# Patient Record
Sex: Female | Born: 1965 | Race: White | Hispanic: No | Marital: Married | Smoking: Never smoker
Health system: Southern US, Community
[De-identification: ages and names within clinical notes are randomized; demographics above are authoritative.]

## PROBLEM LIST (undated history)

## (undated) HISTORY — PX: SHOULDER SURGERY: SHX246

---

## 1999-02-10 ENCOUNTER — Ambulatory Visit (HOSPITAL_COMMUNITY): Admission: RE | Admit: 1999-02-10 | Discharge: 1999-02-10 | Payer: Self-pay | Admitting: Gastroenterology

## 2000-01-09 ENCOUNTER — Inpatient Hospital Stay (HOSPITAL_COMMUNITY): Admission: AD | Admit: 2000-01-09 | Discharge: 2000-01-12 | Payer: Self-pay | Admitting: Obstetrics & Gynecology

## 2001-03-13 ENCOUNTER — Other Ambulatory Visit: Admission: RE | Admit: 2001-03-13 | Discharge: 2001-03-13 | Payer: Self-pay | Admitting: Obstetrics and Gynecology

## 2002-07-12 ENCOUNTER — Other Ambulatory Visit: Admission: RE | Admit: 2002-07-12 | Discharge: 2002-07-12 | Payer: Self-pay | Admitting: Obstetrics and Gynecology

## 2002-10-24 ENCOUNTER — Emergency Department (HOSPITAL_COMMUNITY): Admission: EM | Admit: 2002-10-24 | Discharge: 2002-10-24 | Payer: Self-pay | Admitting: Emergency Medicine

## 2002-10-24 ENCOUNTER — Encounter: Payer: Self-pay | Admitting: Emergency Medicine

## 2003-07-26 ENCOUNTER — Other Ambulatory Visit: Admission: RE | Admit: 2003-07-26 | Discharge: 2003-07-26 | Payer: Self-pay | Admitting: Obstetrics and Gynecology

## 2003-10-26 HISTORY — PX: APPENDECTOMY: SHX54

## 2003-10-26 HISTORY — PX: COLON SURGERY: SHX602

## 2004-03-03 ENCOUNTER — Encounter (INDEPENDENT_AMBULATORY_CARE_PROVIDER_SITE_OTHER): Payer: Self-pay | Admitting: Specialist

## 2004-03-03 ENCOUNTER — Ambulatory Visit (HOSPITAL_COMMUNITY): Admission: RE | Admit: 2004-03-03 | Discharge: 2004-03-03 | Payer: Self-pay | Admitting: Gastroenterology

## 2004-03-13 ENCOUNTER — Encounter: Admission: RE | Admit: 2004-03-13 | Discharge: 2004-03-13 | Payer: Self-pay | Admitting: Gastroenterology

## 2004-04-16 ENCOUNTER — Inpatient Hospital Stay (HOSPITAL_COMMUNITY): Admission: RE | Admit: 2004-04-16 | Discharge: 2004-04-21 | Payer: Self-pay | Admitting: General Surgery

## 2004-04-16 ENCOUNTER — Encounter (INDEPENDENT_AMBULATORY_CARE_PROVIDER_SITE_OTHER): Payer: Self-pay | Admitting: Specialist

## 2004-07-28 ENCOUNTER — Other Ambulatory Visit: Admission: RE | Admit: 2004-07-28 | Discharge: 2004-07-28 | Payer: Self-pay | Admitting: Obstetrics and Gynecology

## 2005-04-19 ENCOUNTER — Ambulatory Visit: Payer: Self-pay | Admitting: Family Medicine

## 2005-08-26 ENCOUNTER — Other Ambulatory Visit: Admission: RE | Admit: 2005-08-26 | Discharge: 2005-08-26 | Payer: Self-pay | Admitting: Obstetrics and Gynecology

## 2005-10-04 ENCOUNTER — Ambulatory Visit: Payer: Self-pay | Admitting: Family Medicine

## 2006-06-07 ENCOUNTER — Ambulatory Visit: Payer: Self-pay | Admitting: Family Medicine

## 2006-09-23 ENCOUNTER — Ambulatory Visit: Payer: Self-pay | Admitting: Family Medicine

## 2007-04-27 ENCOUNTER — Emergency Department (HOSPITAL_COMMUNITY): Admission: EM | Admit: 2007-04-27 | Discharge: 2007-04-27 | Payer: Self-pay | Admitting: Emergency Medicine

## 2007-05-12 ENCOUNTER — Ambulatory Visit (HOSPITAL_COMMUNITY): Admission: RE | Admit: 2007-05-12 | Discharge: 2007-05-12 | Payer: Self-pay | Admitting: Orthopedic Surgery

## 2007-07-28 ENCOUNTER — Ambulatory Visit (HOSPITAL_COMMUNITY): Admission: RE | Admit: 2007-07-28 | Discharge: 2007-07-28 | Payer: Self-pay | Admitting: Orthopedic Surgery

## 2007-09-15 ENCOUNTER — Ambulatory Visit: Payer: Self-pay | Admitting: Family Medicine

## 2008-11-12 ENCOUNTER — Ambulatory Visit: Payer: Self-pay | Admitting: Family Medicine

## 2010-09-24 ENCOUNTER — Ambulatory Visit: Payer: Self-pay | Admitting: Family Medicine

## 2010-09-24 DIAGNOSIS — R1013 Epigastric pain: Secondary | ICD-10-CM

## 2010-09-24 DIAGNOSIS — R110 Nausea: Secondary | ICD-10-CM

## 2010-09-24 DIAGNOSIS — R109 Unspecified abdominal pain: Secondary | ICD-10-CM

## 2010-09-25 ENCOUNTER — Encounter: Admission: RE | Admit: 2010-09-25 | Discharge: 2010-09-25 | Payer: Self-pay | Admitting: Family Medicine

## 2010-10-27 ENCOUNTER — Encounter: Payer: Self-pay | Admitting: Family Medicine

## 2010-11-26 NOTE — Assessment & Plan Note (Signed)
Summary: STOMACH PROBLEMS/DLO   Vital Signs:  Patient profile:   45 year old female Height:      67.75 inches Weight:      129.75 pounds BMI:     19.95 Temp:     98 degrees F oral Pulse rate:   64 / minute Pulse rhythm:   regular BP sitting:   118 / 76  (left arm) Cuff size:   regular  Vitals Entered By: Lewanda Rife LPN (September 24, 2010 8:37 AM) CC: upper stomach hurts when sits for long trips or after eats pt feels full. Day before menstrual period has lower stomach cramps.   History of Present Illness: has not been here since 2007- has been healthy   for past few years - when she sits and travels -- she gets intense stomach ache  all across upper abdomen - and also radiates to back below shoulder blades  no change with eating at all  4 times in past month -- woke up with bad pain and nausea and could not vomit -- lasted 10 minutes  no stool changes -- but has had generally loose stool for 10 years  hurts to have tight jeans on - without elastic  once without traveling  got relief by hanging over the bed  random and happening more often  if she eats or drinks anything -- gets really bad gas -- both belching and flatus (is embarrasing )  ? if related  no diet change   also really bad cramps with her menses for past 6 mo  much worse than previous   last saw gyn -- Dr Edward Jolly  within 6 mo -- in the fall -- gets pap and mam every year and no problems  cramps are worse since then  menses 3-4 d  first day heavy - but no diff than usual   husb with vasectomy-- no OC needed       Allergies (verified): No Known Drug Allergies  Past History:  Family History: Last updated: 09/24/2010 father HTN , migraines  mother colon cancer  gparents -- CHF brother HTN   Social History: Last updated: 09/24/2010 married  works at a horse farm   Past Medical History: anxiety CS stenosis    [facility]- [facility]- [facility]- gyn  [facility] - [facility]o   Past Surgical  History: colonosc 5/05 large polyp  6/05 peri cecal mass -- mucocele 6/05 laparoscopy R colectomy and appy 6/08 colonosc neg   Family History: father HTN , migraines  mother colon cancer  gparents -- CHF brother HTN   Social History: married  works at a horse farm   Review of Systems General:  Denies chills, fatigue, fever, loss of appetite, and malaise. Eyes:  Denies blurring and eye irritation. CV:  Denies chest pain or discomfort, lightheadness, palpitations, shortness of breath with exertion, and swelling of feet. Resp:  Denies cough and wheezing. GI:  Complains of abdominal pain, gas, and nausea; denies bloody stools, change in bowel habits, dark tarry stools, indigestion, loss of appetite, and vomiting. GU:  Denies abnormal vaginal bleeding, discharge, dysuria, and urinary frequency. MS:  Denies joint redness and joint swelling. Derm:  Denies itching, lesion(s), poor wound healing, and rash. Neuro:  Denies numbness and tingling. Psych:  Denies anxiety and depression.  Physical Exam  General:  slim and well appearing  Head:  normocephalic, atraumatic, and no abnormalities observed.   Eyes:  vision grossly intact, pupils equal, pupils round, and pupils reactive to  light.  no conjunctival pallor, injection or icterus  Mouth:  pharynx pink and moist.   Neck:  supple with full rom and no masses or thyromegally, no JVD or carotid bruit  Chest Wall:  No deformities, masses, or tenderness noted. Lungs:  Normal respiratory effort, chest expands symmetrically. Lungs are clear to auscultation, no crackles or wheezes. Heart:  Normal rate and regular rhythm. S1 and S2 normal without gallop, murmur, click, rub or other extra sounds. Abdomen:  Bowel sounds positive,abdomen soft and non-tender without masses, organomegaly or hernias noted. no pulsitile mass felt no suprapubic tenderness or fullness felt  Msk:  No deformity or scoliosis noted of thoracic or lumbar spine.   Extremities:   No clubbing, cyanosis, edema, or deformity noted with normal full range of motion of all joints.   Neurologic:  gait normal and DTRs symmetrical and normal.   Skin:  Intact without suspicious lesions or rashes no pallor or jaundice Cervical Nodes:  No lymphadenopathy noted Inguinal Nodes:  No significant adenopathy Psych:  normal affect, talkative and pleasant    Impression & Recommendations:  Problem # 1:  ABDOMINAL PAIN, EPIGASTRIC (ICD-789.06) Assessment New intermittent / sometimes severe and rad to back  also intermittent nausea  hx of colectomy - disc poss of scar tissue or other complications -- due for colonosc 2012 also  ref to GI [place] abd trial of omeprazole for poss gastritis  Orders: Radiology Referral (Radiology) Gastroenterology Referral (GI) Prescription Created Electronically [phone])  Problem # 2:  NAUSEA (ICD-787.02) Assessment: New see above Orders: Radiology Referral (Radiology) Gastroenterology Referral (GI) Prescription Created Electronically [phone])  Problem # 3:  PELVIC  PAIN (ICD-789.09) Assessment: New primarily menstrual but also with bloating and upper abd pain  disc poss of perimen change in menses pelvic US ordered to image ovaries -- also in light of early satiety  Orders: Radiology Referral (Radiology) Prescription Created Electronically [phone])  Complete Medication List: 1)  Tums 500 Mg Chew (Calcium carbonate antacid) .... Otc as directed. 2)  Prilosec 20 Mg Cpdr (Omeprazole) .Marland Kitchen.. 1 by mouth once daily in am  Patient Instructions: 1)  avoid much caffiene or spicy foods  2)  take omeprazole daily  3)  we will do ref to GI at check out  4)  we will do ref for ultrasound at check out  Prescriptions: PRILOSEC 20 MG CPDR (OMEPRAZOLE) 1 by mouth once daily in am  #30 x 11   Entered and Authorized by:   Judith Part MD   Signed by:   Judith Part MD on 09/24/2010   Method used:   Electronically to        [facility]  [address]  [phone]* (retail)       [address]       [place], [place]  [id number]       Ph: [phone] or [phone]       Fax: [phone]   RxID:   [id number]    Orders Added: 1)  Radiology Referral [Radiology] 2)  Gastroenterology Referral [GI] 3)  Prescription Created Electronically [[id number]] 4)  Est. Patient Level IV [16010]   Immunization History:  Influenza Immunization History:    Influenza:  historical received at church (08/25/2010)   Immunization History:  Influenza Immunization History:    Influenza:  Historical received at church (08/25/2010)  Current Allergies (reviewed today): No known allergies    Appended Document: STOMACH PROBLEMS/DLO office note faxed to  Dr Randa Evens [phone] as instructed.Marland Kitchen

## 2010-11-26 NOTE — Letter (Signed)
Summary: [facility]   Imported By: Maryln Gottron 11/03/2010 13:51:33  _____________________________________________________________________  External Attachment:    Type:   Image     Comment:   External Document

## 2010-11-26 NOTE — Consult Note (Signed)
Summary: [facility]  [facility]   Imported By: Maryln Gottron 10/05/2010 10:26:30  _____________________________________________________________________  External Attachment:    Type:   Image     Comment:   External Document

## 2011-03-09 NOTE — Consult Note (Signed)
NAME:  Bethany Herman, Bethany Herman                  ACCOUNT NO.:  [id number]   MEDICAL RECORD NO.:  [id number]          PATIENT TYPE:  EMS   LOCATION:  MAJO                         FACILITY:  [facility]   PHYSICIAN:  John L. Rendall, M.D.  DATE OF BIRTH:  02-08-1966   DATE OF CONSULTATION:  04/27/2007  DATE OF DISCHARGE:                                 CONSULTATION   CHIEF COMPLAINT:  Right shoulder and neck injury, struck by a horse.   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 Pafford on her right shoulder  and side of her neck.  She was brought to the hospital by EMS in obvious  discomfort with a deformity of the right shoulder   Upon examination, she had an obvious deformity of the right shoulder  with the humeral head displaced out of the socket approximately 3 inches  inferiorly.  There was paresthesia in the entire hand, but the ulnar  intrinsics, radial musculature and median nerve musculature was all  functioning in the hand.  She had a 2+ radial pulse.  Her neck had mild  discomfort along the right side.  X-rays were obtained of the shoulder  that showed an inferior dislocation of the humeral head with fragments  of bone consistent with greater tuberosity, and there was one the  picture of the shoulder showing defect in the humeral head at the  greater tuberosity.   The patient was planned for the CT scanner at this point, but it was  impossible to put her in the scanner with her shoulder dislocated and  arm at 90 degrees to her body.  Consequently, I injected the shoulder  socket with 2% Xylocaine 10 mL after sterile Betadine prep, and with  sheet in the axilla and longitudinal traction on the arm, the shoulder  was reduced in approximately 1 minute.  Gentle motion was then nontender  and stable.  CT scan head and neck was done and read by Dr. Karin Golden  who  saw no fracture or dislocation.  Post-reduction films in the shoulder  then show reduction of the bone fragments into the greater tuberosity  consistent with greater tuberosity avulsion fracture, now anatomically  reduced.   IMPRESSION:  Inferior dislocation humeral head with fracture, greater  tuberosity, and subsequent anatomic reduction.   PLAN:  Shoulder immobilizer, Vicodin for pain, recheck in the office in  2 weeks, disabled for her usual equestrian activities from a minimum of  6 weeks.      John L. Rendall, M.D.  Electronically Signed     JLR/MEDQ  D:  04/27/2007  T:  04/28/2007  Job:  [id number]

## 2011-03-12 NOTE — Op Note (Signed)
NAME:  Bethany Herman, Bethany Herman                            ACCOUNT NO.:  [id number]   MEDICAL RECORD NO.:  [id number]                   PATIENT TYPE:  AMB   LOCATION:  ENDO                                 FACILITY:  [facility]   PHYSICIAN:  James L. Malon Kindle., M.D.          DATE OF BIRTH:  1965/12/09   DATE OF PROCEDURE:  03/03/2004  DATE OF DISCHARGE:                                 OPERATIVE REPORT   PROCEDURE:  Colonoscopy and biopsy.   ENDOSCOPIST:  Llana Aliment. Edwards, M.D.   MEDICATIONS:  Fentanyl 125 mcg, Versed 12 mg IV.   SCOPE:  Olympus pediatric adjustable colonoscope.   INDICATIONS:  Strong family history of colon cancer in mother, maternal  grandmother had colon cancer and multiple family members had polyps.  She  had a negative colonoscopy five years ago, and this is a 45-year-old  followup.   DESCRIPTION OF PROCEDURE:  The procedure had been explained to the patient  and consent obtained.  With the patient in the left lateral decubitus  position, the Olympus gastroscope was inserted and advanced.  We were able  to advance easily to the cecum.  The patient had what initially appeared to  be a very large ileocecal valve but after placing her in the supine  position, this turned out to be a polypoid mass.  It appeared to be  emanating from the appendix.  The polypoid mass appeared to be emanating  from the appendix.  It was clearly away from the ileocecal valve.  It was  approximately 4 cm diameter.  At that point, it had the appearance of  perfectly round lipoma.  A biopsy forceps was then inserted.  It did not  have a pillow sign.  The mass was somewhat hard but mobile.  It appeared to  be attached at the appendiceal orifice.  I never could never actually see  the appendiceal orifice; it was underneath.  Several biopsies were obtained.  Given the size, location and uncertainty as to the source of this I chose  not to perform a polypectomy at this time.  Due to the massive size, I  thought the risk would be too great.  The scope was withdrawn.  There were  no other polyps seen in the ascending colon, transverse, descending,  sigmoid.  The rectum was also free of polyps.  The scope was withdrawn.  The  patient tolerated the procedure well.   ASSESSMENT:  Massive cecal polyp (211.3).  This does not have the appearance  of a typical adenomatous polyp.  It actually appears more like a submucosal  mass of some sort.  It is difficult to tell.  It appears to be covered with  normal-appearing mucosa.  It is somewhat a hard leiomyoma, carcinoid versus  an adenocarcinoma all as a differential.  Due to the massive size, I think  an endoscopic removal would be quite risky.   PLAN:  We will go ahead and have the patient return to the office after this  pathology is back, and we will make a decision regarding where to go from  here.                                               James L. Malon Kindle., M.D.   Waldron Session  D:  03/03/2004  T:  03/03/2004  Job:  [id number]   cc:   Marne A. Milinda Antis, M.D. [facility]

## 2011-03-12 NOTE — Op Note (Signed)
NAME:  Bethany Herman, Bethany Herman                            ACCOUNT NO.:  [id number]   MEDICAL RECORD NO.:  [id number]                   PATIENT TYPE:  INP   LOCATION:  0357                                 FACILITY:  [facility]   PHYSICIAN:  Angelia Mould. Derrell Lolling, M.D.             DATE OF BIRTH:  [birth year]   DATE OF PROCEDURE:  04/16/2004  DATE OF DISCHARGE:                                 OPERATIVE REPORT   PREOPERATIVE DIAGNOSES:  Mucocele of the appendix.   POSTOPERATIVE DIAGNOSES:  Mucocele of the appendix.   OPERATION:  Diagnostic laparoscopy, right colectomy.   SURGEON:  Angelia Mould. Derrell Lolling, M.D.   FIRST ASSISTANT:  Leonie Man, M.D.   INDICATIONS FOR PROCEDURE:  This is a healthy, 45 year old white female with  a family history of colon cancer in her mother and maternal grandfather. She  had a screening colonoscopy last month which showed a submucosal mass in the  cecum.  This was biopsied and showed benign mucosa. A CT scan showed a large  dilated fluid filled appendix with peripheral calcifications consistent with  a mucocele of the appendix.  There was no evidence of free fluid. There was  no evidence of pseudomyxoma peritonei.  The patient's physical exam is  unremarkable. She was counseled to have a right colectomy.  She is brought  the operating room electively for a diagnostic laparoscopy to rule out any  pseudomyxoma peritonei and then right colectomy.   FINDINGS:  There was about a 3-4 cm smooth rounded mass at the base of the  appendix bulging into the cecal wall.  There was no adenopathy or signs of  any tumor spread anywhere.  Specifically the subdiaphragmatic spaces, liver,  spleen, tubes and ovaries and omentum looked fine. There was no evidence of  any tumor anywhere.  The terminal ileum looked normal.   TECHNIQUE:  Following the induction of general endotracheal anesthesia, a  Foley catheter was placed. The patient was given intravenous antibiotics,  and the abdomen was  prepped and draped in a sterile fashion.  A short  transverse incision was made at the superior rim of the umbilicus. The  fascia was incised transversely and the abdominal cavity entered under  direct vision.  A 10 mm Hasson trocar was inserted and secured with  pursestring suture of #0 Vicryl.  Pneumoperitoneum was created. The video  camera was inserted and with the patient in multiple positions, the entire  subdiaphragmatic space, abdominal space and pelvic areas were examined.  Everything looked perfectly normal.   At this point, the pneumoperitoneum was released and trocar was removed. A  transverse incision was made in the right abdomen at the level of the trocar  site insertion.  The rectus muscle on the right was divided with  electrocautery.  The posterior rectus sheath was divided and the abdominal  cavity entered and explored. With the findings as described above. We  mobilized  the right colon by dividing the lateral peritoneal attachments  inferiorly and then mobilized it more superiorly using electrocautery.  We  were very careful to mobilize the colon completely up off the duodenum and  the kidney until we had mobilized around the right transverse colon and  could clearly visualize the middle colic vessels.  We cleaned off the colon  just to the right of the middle colic vessels, __________ marginal arteries  were clamped, divided and ligated with 2-0 silk ties.  We divided the colon  with the GIA stapling device at this point.  We then cleaned off the  terminal ileum about 3 inches proximal to the ileocecal valve. We divided  the terminal ileum with the GIA stapling device. The mesenteric vessels were  then skeletonized, isolated, clamped, divided and doubly ligated with 2-0  silk ties. The specimen was then removed. Anastomosis was created between  the terminal ileum and the mid transverse colon using a GIA stapling device.  The defect in the bowel wall was closed with a  TA-60 stapling device. Great  care was taken to check for bleeding at all steps.  After completing the  anastomosis, we changed our instruments and gloves and suction devices.  The  mesentery was closed with interrupted figure-of-eight sutures of 3-0 silk.  We checked the anastomosis and it was quite large being almost three fingers  in width. We then checked for bleeding within the abdominal cavity and there  was no bleeding in any of the areas of dissection. We copiously irrigated  the abdomen and pelvis with 2000-3000 mL of saline.  Once again checking for  bleeding there was one. The colon and omentum were returned to their  anatomic positions. The posterior rectus sheath was closed with a running  suture of #1 PDS.  The anterior rectus sheath and the linea alba were closed  with a running suture of #1 PDS.  The skin was closed with skin staples.  Clean bandages were placed and the patient taken to the recovery room in  stable condition.  Estimated blood loss was about 125 mL, complications  none. Sponge, needle and instrument counts were correct.                                               Angelia Mould. Derrell Lolling, M.D.    HMI/MEDQ  D:  04/16/2004  T:  04/16/2004  Job:  [id number]   cc:   Fayrene Fearing L. Malon Kindle., M.D.  [address]  [place]  [place] [address]  Fax: [phone]   Audrie Gallus. Milinda Antis, M.D. [facility]

## 2011-03-12 NOTE — Discharge Summary (Signed)
NAME:  Bethany Herman, Bethany Herman                            ACCOUNT NO.:  [id number]   MEDICAL RECORD NO.:  [id number]                   PATIENT TYPE:  INP   LOCATION:  0370                                 [facility]ACILITY:  [facility]   PHYSICIAN:  Angelia Mould. Derrell Lolling, M.D.             DATE OF BIRTH:  [birth year]   DATE OF ADMISSION:  04/16/2004  DATE OF DISCHARGE:  04/21/2004                                 DISCHARGE SUMMARY   FINAL DIAGNOSIS:  Cystic low-grade appendicle mucinous neoplasm.   OPERATION PERFORMED:  Diagnostic laparoscopy, right colectomy.   HISTORY OF PRESENT ILLNESS:  This is a 45 year old white female in good  health.  She has a positive family history for colon cancer.  She gets a  screening colonoscopy every five years.  A recent colonoscopy showed a  submucosal mass in the cecum.  Biopsy showed only normal colonic mucosa.  A  CT scan showed a dilated fluid-filled appendix with peripheral  calcifications consistent with a mucocele of the appendix but no other  abnormalities were noted on the CT scan.  Specifically, there was no free  fluid and no evidence of pseudomyxoma peritonei.  She was sent to me for  evaluation in the office.  I advised surgical resection of this area and  assessment for malignancy, although it was unclear whether this was a cyst  adenoma or a cyst adenocarcinoma.  She underwent bowel prep at home and was  brought to the hospital electively.   PHYSICAL EXAMINATION:  VITAL SIGNS:  Weight 133.  Height 5 feet 8 inches.  GENERAL:  A healthy-appearing young woman in no distress.  NECK:  No adenopathy.  LUNGS:  Clear to auscultation.  HEART:  Regular rate and rhythm.  No murmur.  ABDOMEN:  Soft, scaphoid.  Nontender.  No palpable mass.   HOSPITAL COURSE:  On the day of admission, the patient was taken to the  operating room.  We performed diagnostic laparoscopy and looked thoroughly  around the abdomen and pelvis and saw no evidence of any tumor anywhere.  We  then  performed a right colectomy through a short transverse right-sided  abdominal incision.  I could palpate this fluid mass in the wall of the  cecum, but there were no signs of any adenopathy.  The surgery was  uneventful.   Final pathology report shows a mucinous neoplasm of the appendix.  There was  minima atypia.  There were no signs of any invasion.  Seven lymph nodes were  examined, and there was no tumor.  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   Postoperatively, the patient did well.  She progressed very steadily over  the next few days with her diet and activities and was ready to go home on  April 21, 2004.  At that time, she was tolerating a regular diet, having  bowel movements.  Her abdomen was soft and benign.  The wound looked good.  Staples were removed.  Steri-Strips were applied.  A prescription for  Vicodin was given.  She was asked to return to see me in the office in two  weeks.                                               Angelia Mould. Derrell Lolling, M.D.    HMI/MEDQ  D:  04/25/2004  T:  04/25/2004  Job:  [id number]   cc:   Fayrene Fearing L. Malon Kindle., M.D.  [address]  [place]  [place] [address]  Fax: [phone]   Audrie Gallus. Milinda Antis, M.D. [facility]

## 2011-09-27 IMAGING — US US PELVIS COMPLETE
2 series · 13 of 25 positions shown · non-contrast
Comparison: None.

CLINICAL DATA: Epigastric and pelvic pain.  LMP 09/05/2010



[Series 1: us pelvis complete · 0.30mm/px · 12 of 70 slices shown (1 of 2)]
[im 1/70]
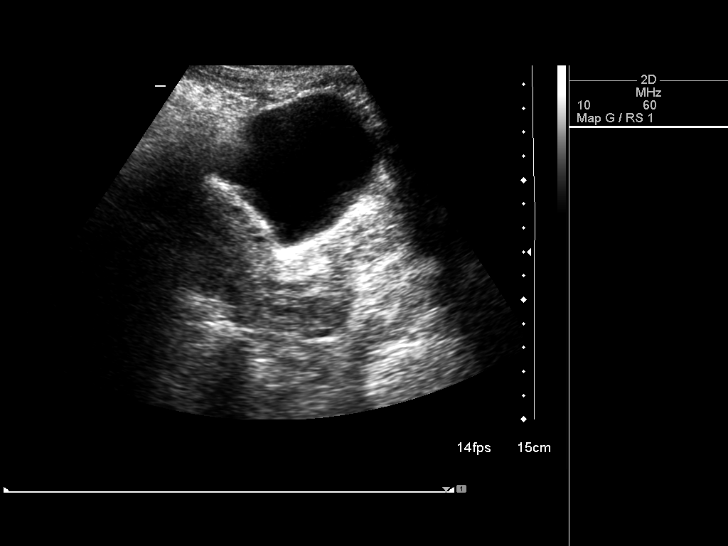
[im 7/70]
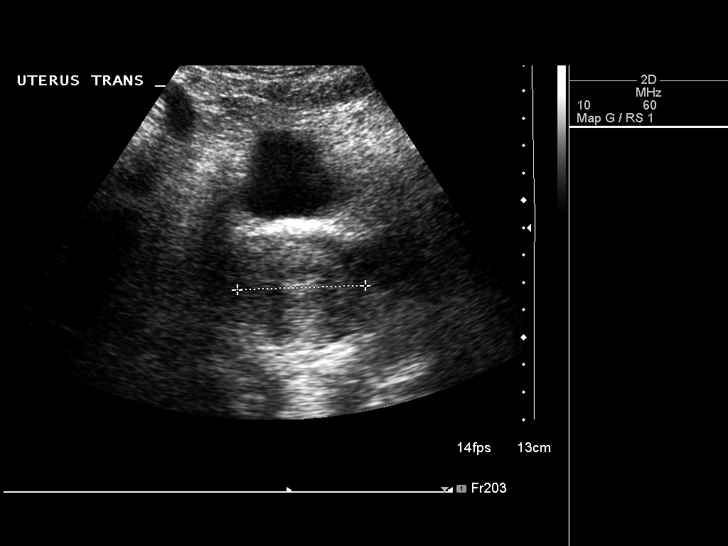
[im 13/70]
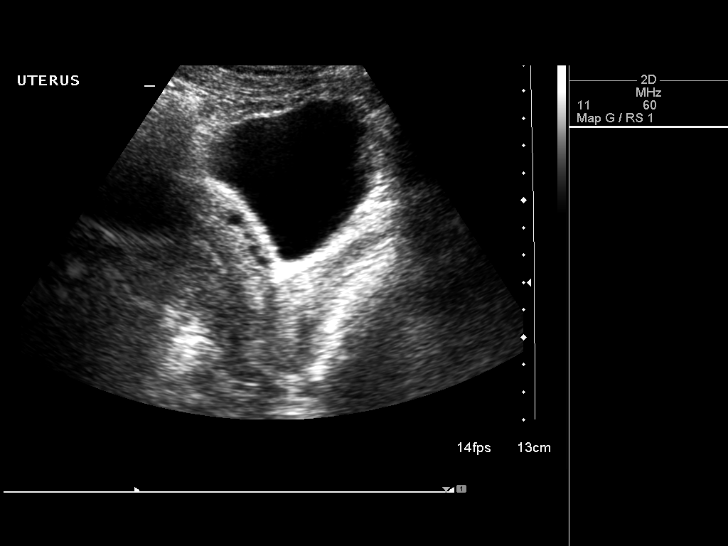
[im 19/70]
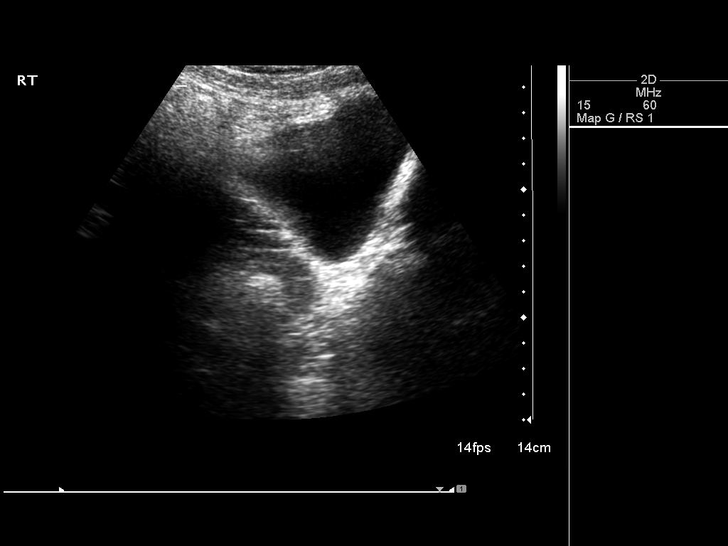
[im 25/70]
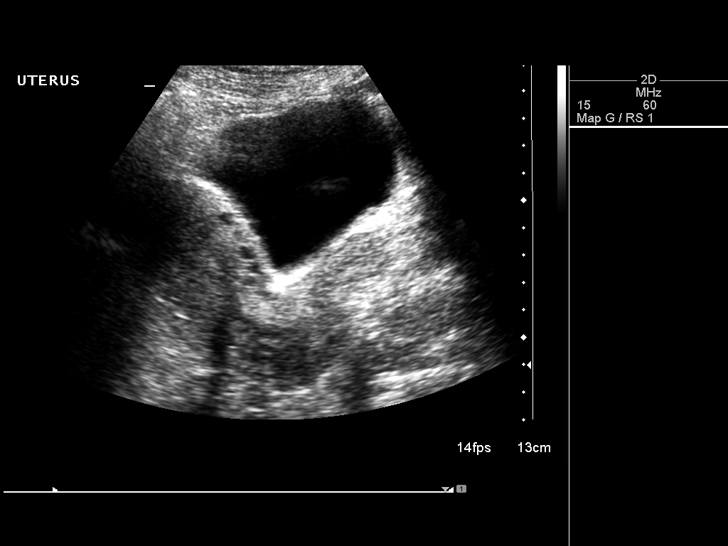
[im 31/70]
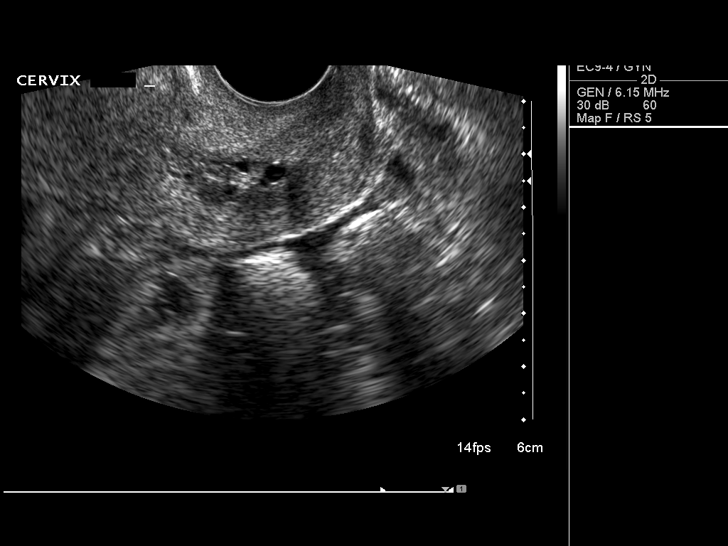
[im 37/70]
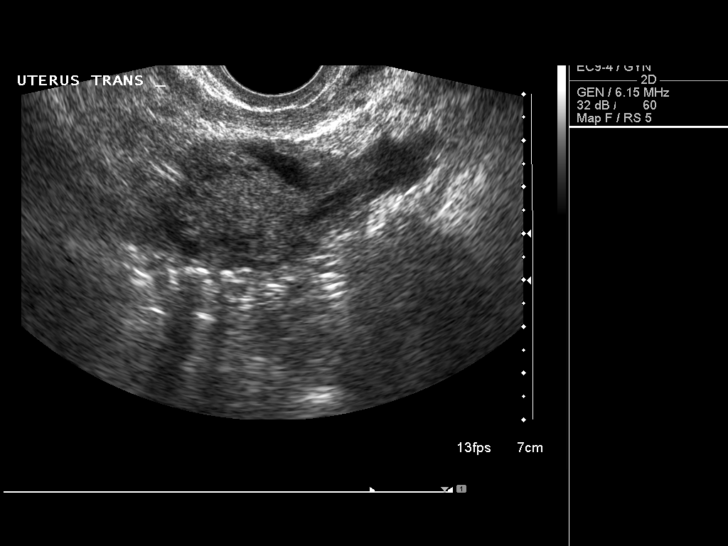
[im 43/70]
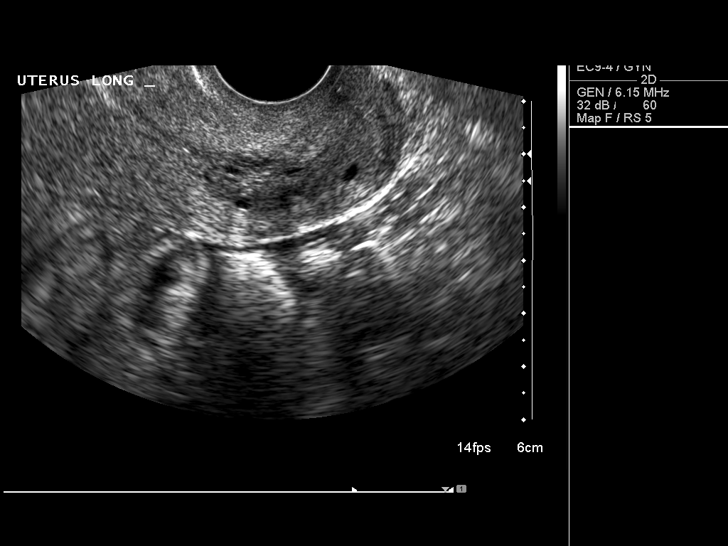
[im 49/70]
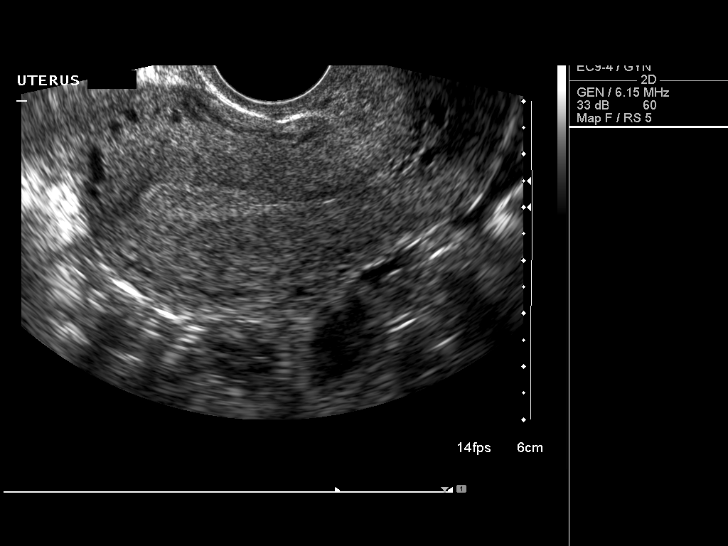
[im 55/70]
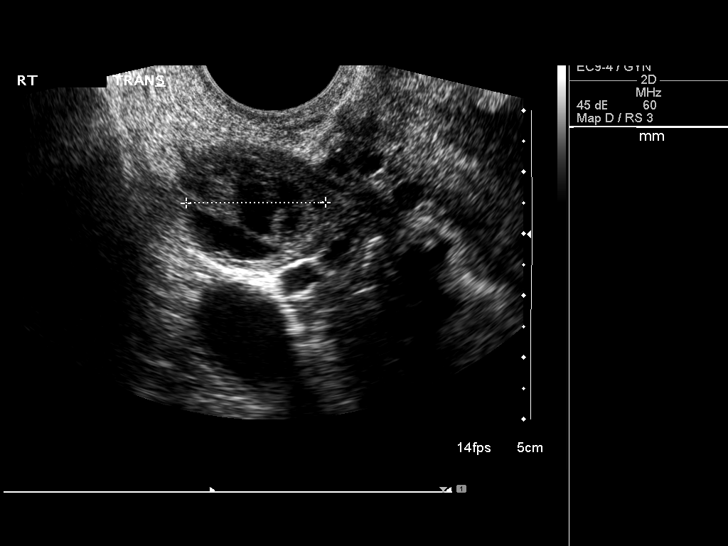
[im 61/70]
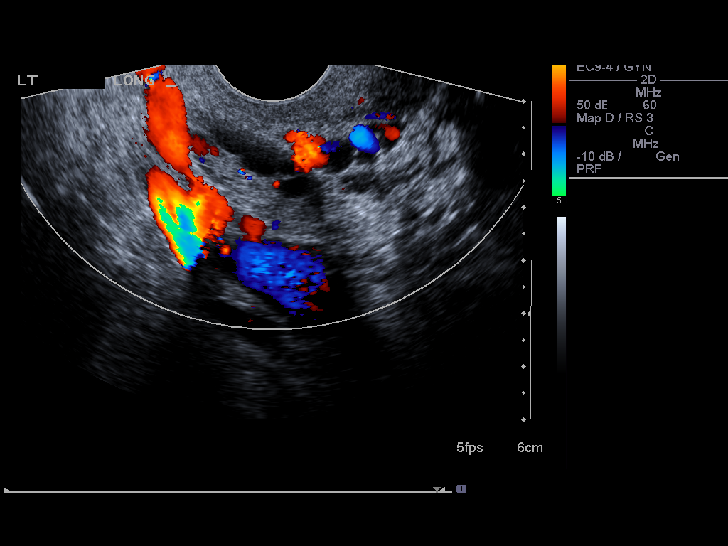
[im 67/70]
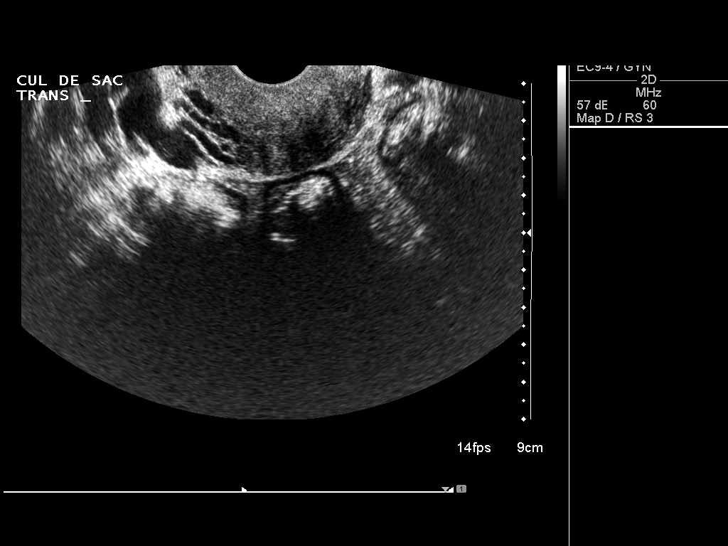

[Series 2: us pelvis complete · 0.26mm/px · 1 of 1 slices shown (2 of 2)]
[im 1/1]
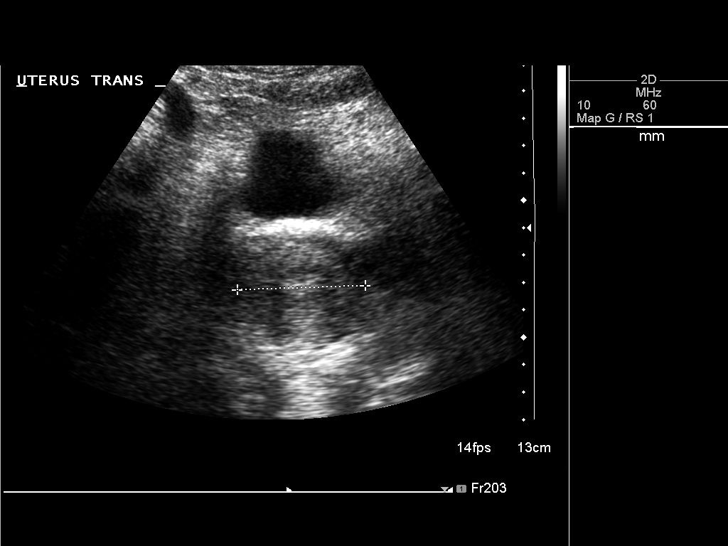

[13 of 25 positions shown; findings below may reference images not displayed]

FINDINGS: Uterus the uterus has a sagittal length of 7.9 cm, an AP depth of
4.2 cm and a transverse width of 5.0 cm.  A homogeneous uterine
myometrium is seen.

Endometrium has a late tri- layered appearance with an AP width of
7.2 mm.  No areas of focal thickening or heterogeneity are seen and
this would correlate with a late periovulatory pelvic ultrasound
and correspond the patient's given LMP of 09/05/2010.

Right Ovary has a normal appearance measuring 2.3 x 1.5 x 2.3 cm
and contains a 2.3 x 1.5 x 2.3 cm avascular complex cyst with,
likely a hemorrhagic corpus luteum

Left Ovary has a normal appearance measuring 3.2 x 1.7 x 2.0 cm

Other Findings:  No pelvic fluid or separate adnexal masses are
seen.
IMPRESSION: Small complex right ovarian cyst likely representing a hemorrhagic
corpus luteum given the patient's phase of cycle and imaging
characteristics. Short-term follow-up is recommended with
rescanning in the immediate postsecretory phase of the cycle
following the next complete cycle given the patient's history of
pelvic pain and  appearance.

Normal uterine myometrium, endometrium and left ovary

## 2011-09-27 IMAGING — US US ABDOMEN COMPLETE
1 series · 14 of 25 positions shown · non-contrast
Comparison: CT abdomen 03/13/2004.

CLINICAL DATA: 44-year-old female with epigastric and pelvic pain.

COMPLETE ABDOMINAL ULTRASOUND

[Series 1: us abdomen complete · 0.30mm/px · 14 of 73 slices shown]
[im 1/73]
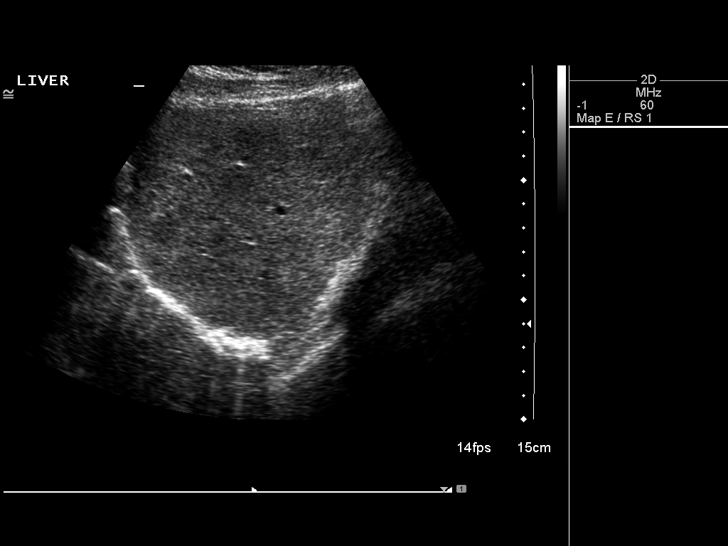
[im 7/73]
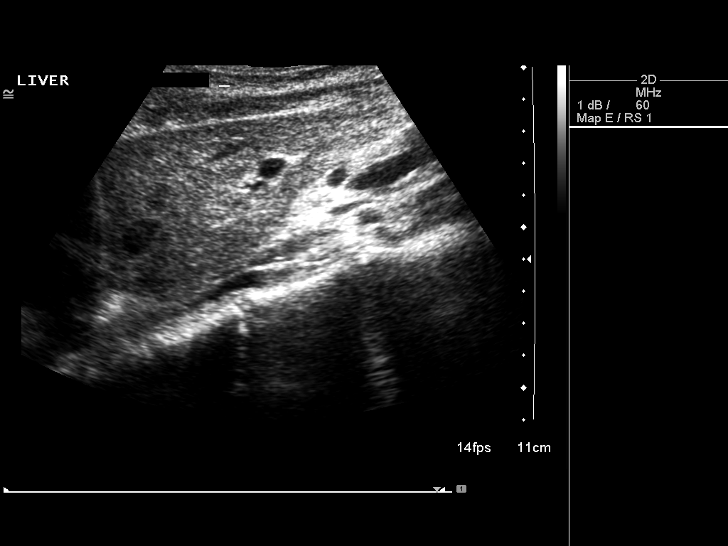
[im 13/73]
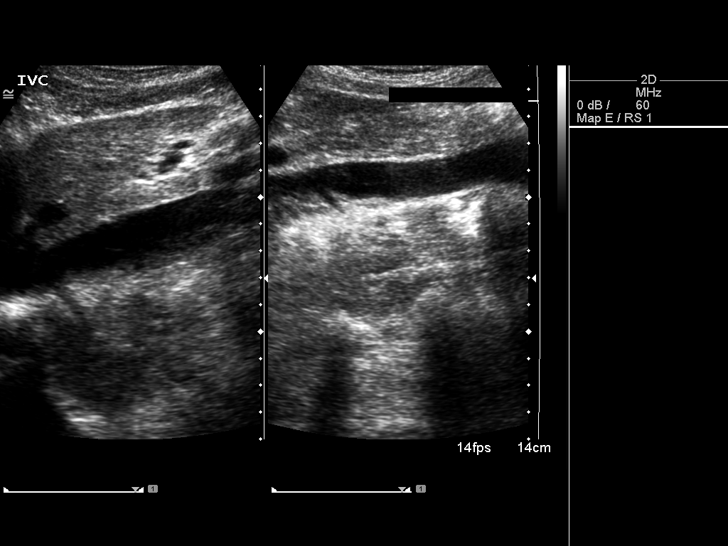
[im 19/73]
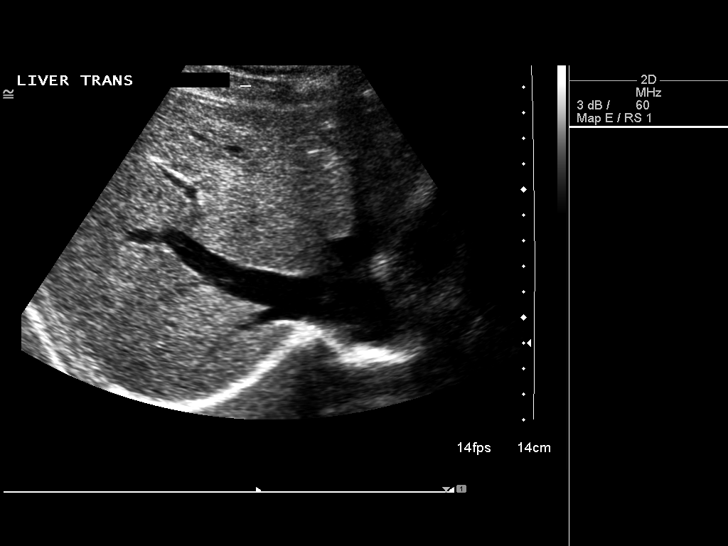
[im 25/73]
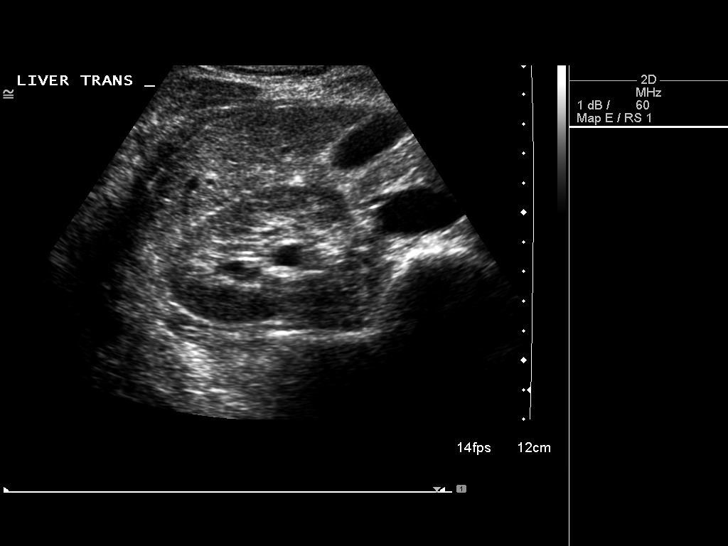
[im 28/73]
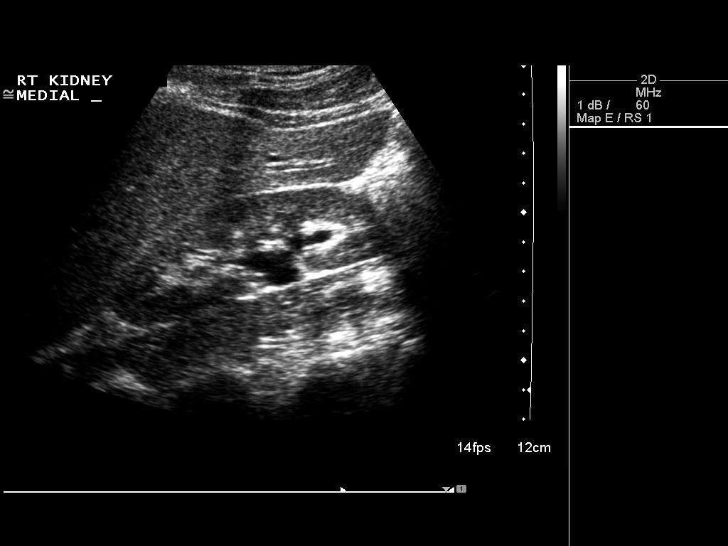
[im 34/73]
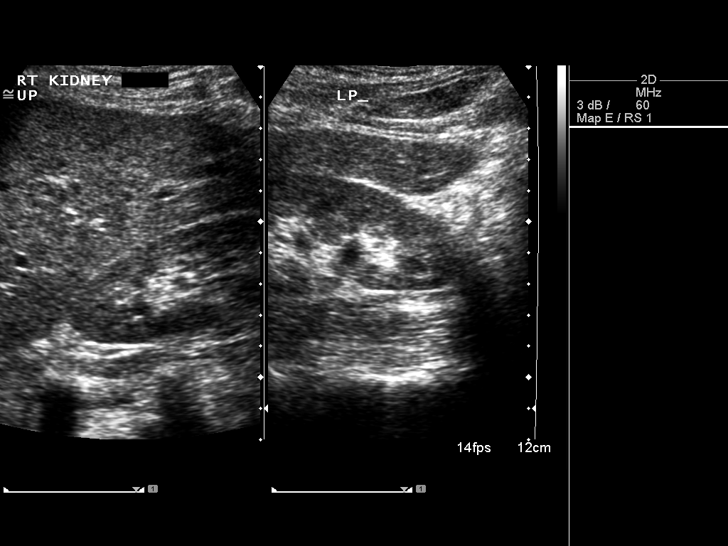
[im 40/73]
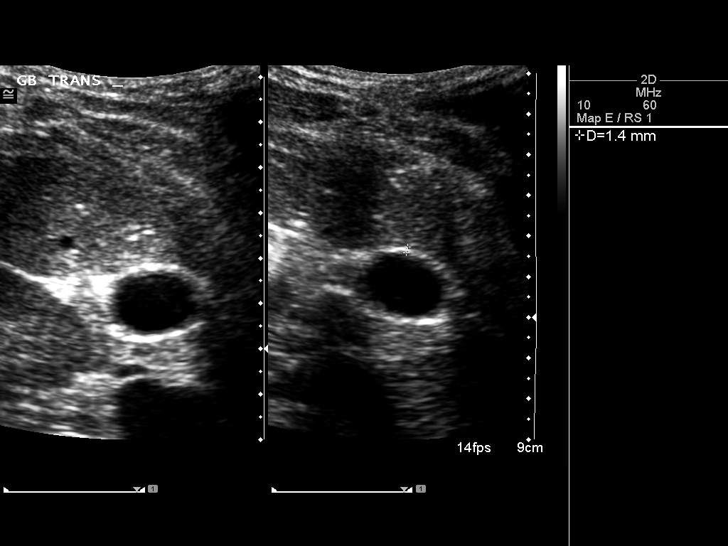
[im 46/73]
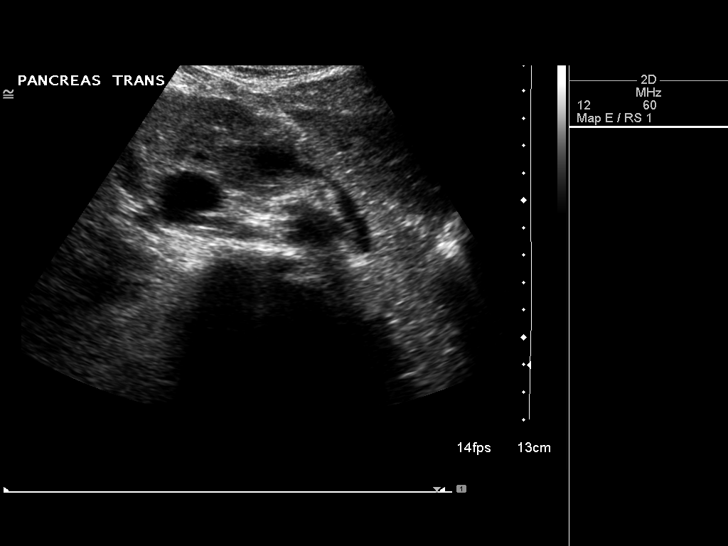
[im 49/73]
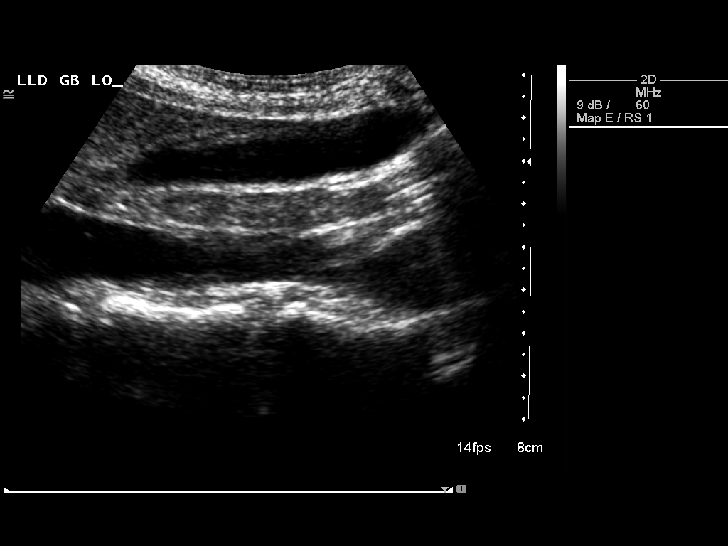
[im 55/73]
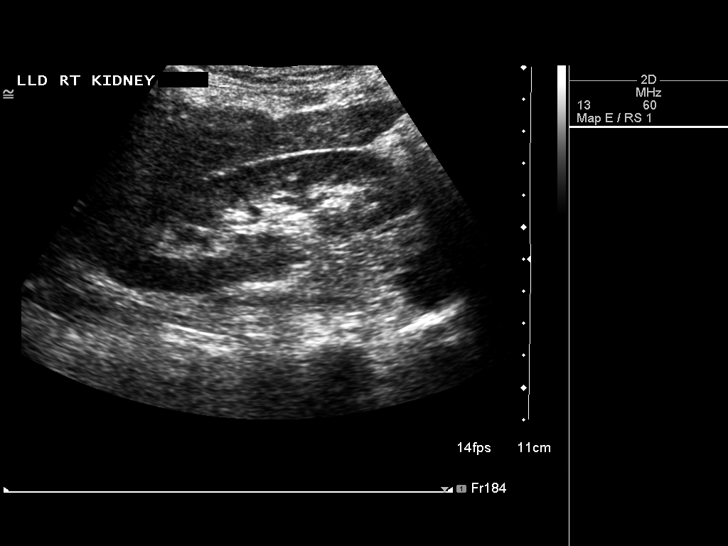
[im 61/73]
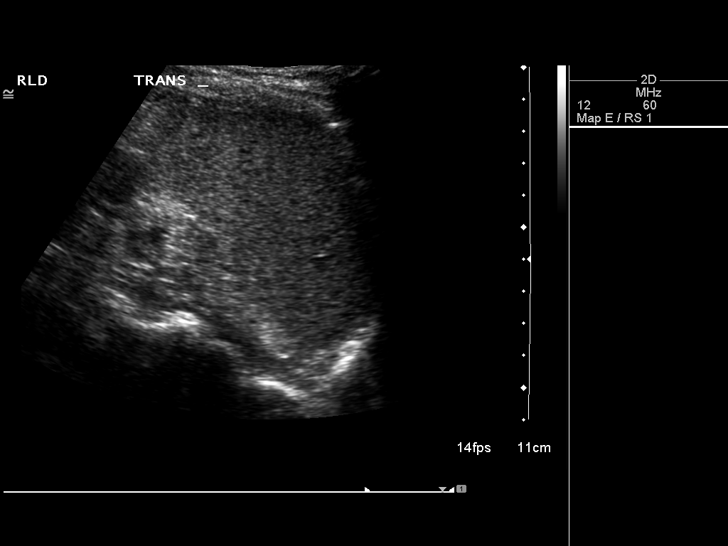
[im 67/73]
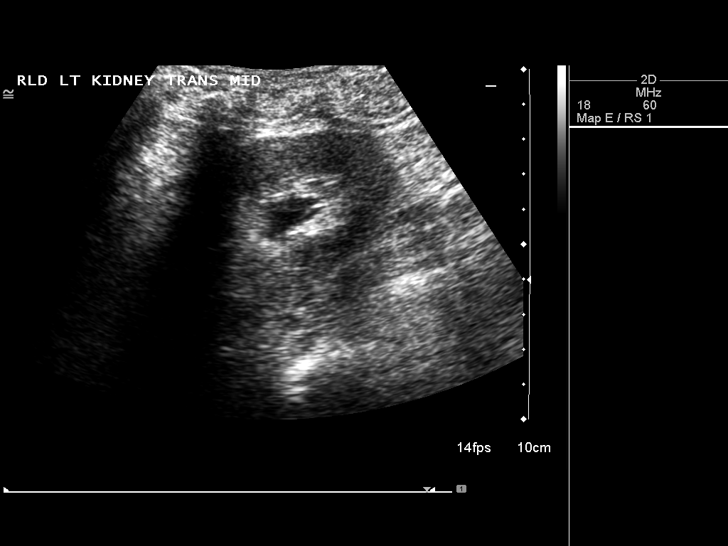
[im 73/73]
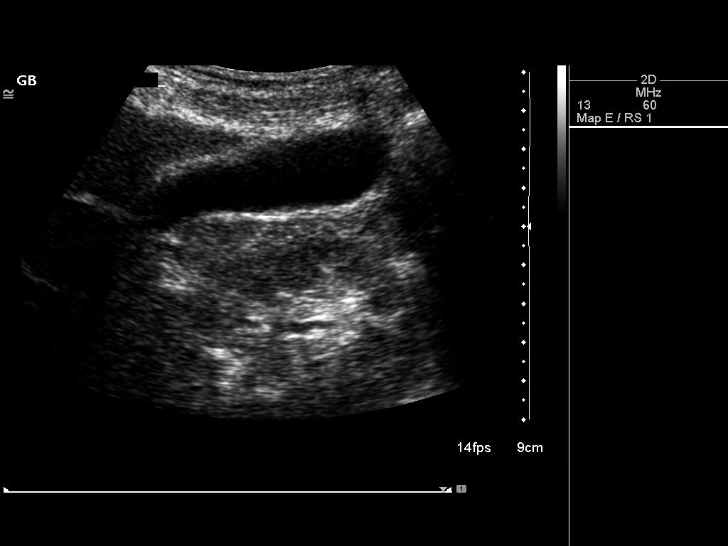

[14 of 25 positions shown; findings below may reference images not displayed]

FINDINGS: Gallbladder:  No gallstones, gallbladder wall thickening, or
pericholecystic fluid.

Common bile duct:  Normal measuring 3 mm in diameter.

Liver:  No focal lesion identified.  Within normal limits in
parenchymal echogenicity.

IVC:  Appears normal.

Pancreas:  No focal abnormality seen.

Spleen:  Normal measuring 6.6 cm in length.

Right Kidney:  Normal measuring 10.8 cm in length.

Left Kidney:  Normal.  There is a small central renal cyst versus
prominent renal pelvis measuring 1.5 x 1.1 x 2.0 cm.  Renal length
10.8 cm.

Abdominal aorta:  No aneurysm identified.
IMPRESSION: 1.  Normal gallbladder and no acute abdominal findings.
2.  Incidental left renal benign cyst versus prominent renal
pelvis.

## 2012-01-13 ENCOUNTER — Other Ambulatory Visit: Payer: Self-pay

## 2012-01-13 MED ORDER — DIAZEPAM 5 MG PO TABS: ORAL_TABLET | ORAL | Status: DC

## 2012-01-13 NOTE — Telephone Encounter (Signed)
Schedule f/u this spring please Px written for call in

## 2012-01-13 NOTE — Telephone Encounter (Signed)
Rx called to [facility], patient advised as instructed via telephone. 

## 2012-01-13 NOTE — Telephone Encounter (Signed)
Pt request refill on Diazepam 5 mg. Pt said last filled in 2005 due to her flying. Pt said she will be flying several times this spring( her first flight is tomorrow 03/15/12). Pt request refill on Diazepam 5 mg #10 [facility] [address]. Pt last seen by Dr Milinda Antis 09/24/2010... Pt can be reached at [phone].

## 2012-02-15 ENCOUNTER — Other Ambulatory Visit: Payer: Self-pay | Admitting: Obstetrics and Gynecology

## 2012-02-15 ENCOUNTER — Other Ambulatory Visit: Payer: Self-pay

## 2012-02-15 DIAGNOSIS — N63 Unspecified lump in unspecified breast: Secondary | ICD-10-CM

## 2012-02-18 ENCOUNTER — Ambulatory Visit
Admission: RE | Admit: 2012-02-18 | Discharge: 2012-02-18 | Disposition: A | Discharge status: Home / Self Care | Payer: BC Managed Care – PPO | Source: Ambulatory Visit | Attending: Obstetrics and Gynecology | Admitting: Obstetrics and Gynecology

## 2012-02-18 DIAGNOSIS — N63 Unspecified lump in unspecified breast: Secondary | ICD-10-CM

## 2012-04-19 HISTORY — PX: COLONOSCOPY: SHX174

## 2012-04-26 ENCOUNTER — Encounter: Payer: Self-pay | Admitting: Family Medicine

## 2012-05-03 ENCOUNTER — Encounter: Payer: Self-pay | Admitting: Family Medicine

## 2012-08-15 ENCOUNTER — Other Ambulatory Visit: Payer: Self-pay | Admitting: Obstetrics and Gynecology

## 2012-08-15 DIAGNOSIS — D249 Benign neoplasm of unspecified breast: Secondary | ICD-10-CM

## 2012-08-21 ENCOUNTER — Ambulatory Visit
Admission: RE | Admit: 2012-08-21 | Discharge: 2012-08-21 | Disposition: A | Discharge status: Home / Self Care | Payer: BC Managed Care – PPO | Source: Ambulatory Visit | Attending: Obstetrics and Gynecology | Admitting: Obstetrics and Gynecology

## 2012-08-21 DIAGNOSIS — D249 Benign neoplasm of unspecified breast: Secondary | ICD-10-CM

## 2012-11-24 ENCOUNTER — Ambulatory Visit (INDEPENDENT_AMBULATORY_CARE_PROVIDER_SITE_OTHER): Payer: BC Managed Care – PPO | Admitting: Family Medicine

## 2012-11-24 ENCOUNTER — Encounter: Payer: Self-pay | Admitting: Family Medicine

## 2012-11-24 VITALS — BP 116/76 | HR 52 | Temp 98.0°F | Ht 68.0 in | Wt 130.0 lb

## 2012-11-24 DIAGNOSIS — Z23 Encounter for immunization: Secondary | ICD-10-CM

## 2012-11-24 DIAGNOSIS — Z0000 Encounter for general adult medical examination without abnormal findings: Secondary | ICD-10-CM

## 2012-11-24 LAB — CBC WITH DIFFERENTIAL/PLATELET
Eosinophils Relative: 0.1 % (ref 0.0–5.0)
Lymphocytes Relative: 34.5 % (ref 12.0–46.0)
Lymphs Abs: 2 10*3/uL (ref 0.7–4.0)
MCV: 93.8 fl (ref 78.0–100.0)
Monocytes Absolute: 0.6 10*3/uL (ref 0.1–1.0)
Monocytes Relative: 9.9 % (ref 3.0–12.0)
Neutro Abs: 3.2 10*3/uL (ref 1.4–7.7)
Neutrophils Relative %: 54.6 % (ref 43.0–77.0)
Platelets: 226 10*3/uL (ref 150.0–400.0)
WBC: 5.8 10*3/uL (ref 4.5–10.5)

## 2012-11-24 LAB — POCT URINALYSIS DIPSTICK
Bilirubin, UA: NEGATIVE
Ketones, UA: NEGATIVE
Leukocytes, UA: NEGATIVE
Nitrite, UA: NEGATIVE
Spec Grav, UA: 1.025
pH, UA: 6.5

## 2012-11-24 LAB — LIPID PANEL
Cholesterol: 190 mg/dL (ref 0–200)
HDL: 85.5 mg/dL (ref >39.00)
LDL Cholesterol: 95 mg/dL (ref 0–99)
Total CHOL/HDL Ratio: 2
VLDL: 10 mg/dL (ref 0.0–40.0)

## 2012-11-24 LAB — COMPREHENSIVE METABOLIC PANEL
Albumin: 3.9 g/dL (ref 3.5–5.2)
Chloride: 102 mEq/L (ref 96–112)
GFR: 82.31 mL/min (ref >60.00)
Glucose, Bld: 89 mg/dL (ref 70–99)
Sodium: 136 mEq/L (ref 135–145)

## 2012-11-24 NOTE — Progress Notes (Signed)
Subjective:    Patient ID: Bethany Herman, female    DOB: Jun 26, 1966, 47 y.o.   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 When her last one  Needs that today  Flu vaccine -- got one today   Pap 4/13 Gyn - she sees Dr Edward Jolly - will be seeing one of her partners - Nestor Ramp Had normal periods -now irregular - and getting close to menopause   Mammogram 4/13 - got in Dr Rica Records office  Had to [facility]  No lumps on self exam   colonosc 6/13 with 5 year recall Dr Randa Evens  Vision good  Hearing good  bp good   Mood- is pretty despite the stressors   Patient Active Problem List  Diagnosis  . NAUSEA  . ABDOMINAL PAIN, EPIGASTRIC  . PELVIC  PAIN  . Routine general medical examination at a health care facility   No past medical history on file. Past Surgical History  Procedure Date  . Colonoscopy 04/19/12    normal/ re check 5 years (Dr Randa Evens)   History  Substance Use Topics  . Smoking status: Never Smoker   . Smokeless tobacco: Not on file  . Alcohol Use: Yes     Comment: social   No family history on file. No Known Allergies Current Outpatient Prescriptions on File Prior to Visit  Medication Sig Dispense Refill  . diazepam (VALIUM) 5 MG tablet 1 by mouth as needed prior to airplane flight  10 tablet  0      Review of Systems Review of Systems  Constitutional: Negative for fever, appetite change, fatigue and unexpected weight change.  Eyes: Negative for pain and visual disturbance.   Respiratory: Negative for cough and shortness of breath.   Cardiovascular: Negative for cp or palpitations    Gastrointestinal: Negative for nausea, diarrhea and constipation.  Genitourinary: Negative for urgency and frequency.  Skin: Negative for pallor or rash   Neurological: Negative for weakness, light-headedness, numbness and headaches.  Hematological: Negative for adenopathy. Does not bruise/bleed easily.  Psychiatric/Behavioral: Negative for dysphoric mood. The patient is not nervous/anxious.         Objective:   Physical Exam  Constitutional: She appears well-developed and well-nourished. No distress.  HENT:  Head: Normocephalic and atraumatic.  Right Ear: External ear normal.  Left Ear: External ear normal.  Nose: Nose normal.  Mouth/Throat: Oropharynx is clear and moist.  Eyes: Conjunctivae normal and EOM are normal. Pupils are equal, round, and reactive to light. Right eye exhibits no discharge. Left eye exhibits no discharge. No scleral icterus.  Neck: Normal range of motion. Neck supple. No JVD present. Carotid bruit is not present. No thyromegaly present.  Cardiovascular: Normal rate, regular rhythm, normal heart sounds and intact distal pulses.  Exam reveals no gallop.   Pulmonary/Chest: Effort normal and breath sounds normal. No respiratory distress. She has no wheezes. She has no  rales.  Abdominal: Soft. Bowel sounds are normal. She exhibits no distension, no abdominal bruit and no mass. There is no tenderness. There is no rebound.  Musculoskeletal: She exhibits no edema and no tenderness.  Lymphadenopathy:    She has no cervical adenopathy.  Neurological: She is alert. She has normal reflexes. No cranial nerve deficit. She exhibits normal muscle tone. Coordination normal.  Skin: Skin is warm and dry. No rash noted. No erythema. No pallor.  Psychiatric: She has a normal mood and affect.          Assessment & Plan:

## 2012-11-24 NOTE — Patient Instructions (Addendum)
Flu shot today Tdap - tetanus shot today Follow up with your dermatologist about skin spots  Labs today  Eat a healthy diet /exercise and take care of yourself  Try to get 1200-1500 mg of calcium per day with at least 1000 iu of vitamin D - for bone health  Drop off your DOT form when you get a chance

## 2012-11-24 NOTE — Assessment & Plan Note (Signed)
Reviewed health habits including diet and exercise and skin cancer prevention Also reviewed health mt list, fam hx and immunizations   Flu shot today Tdap today Wellness labs today Nl ua / vis/ hearing -will drop off DOT form rec f/u with derm for itchy areas on R foot and mid back - ? Eczema or early psoriasis

## 2012-11-27 ENCOUNTER — Encounter: Payer: Self-pay | Admitting: *Deleted

## 2012-11-28 ENCOUNTER — Telehealth: Payer: Self-pay | Admitting: Family Medicine

## 2012-11-28 NOTE — Telephone Encounter (Signed)
Forms placed in your in box.

## 2012-11-28 NOTE — Telephone Encounter (Signed)
Bethany Herman dropped off a Dot physcial from to be filled out by Dr. Milinda Antis.

## 2012-11-28 NOTE — Telephone Encounter (Signed)
Left voicemail letting pt know form is ready for pick-up 

## 2012-11-28 NOTE — Telephone Encounter (Signed)
Done and in IN box 

## 2012-12-11 NOTE — Telephone Encounter (Signed)
Pt received letter from DOT requesting more info for DOT form submitted and pt needs the card filled out for DOT. Pt will get a blank DOT card and bring letter with info needed to office.

## 2012-12-11 NOTE — Telephone Encounter (Signed)
Ok- will do that when it arrives

## 2012-12-13 ENCOUNTER — Telehealth: Payer: Self-pay | Admitting: *Deleted

## 2012-12-13 NOTE — Telephone Encounter (Signed)
Pt notified form ready for pick up 

## 2012-12-13 NOTE — Telephone Encounter (Signed)
Husband Brett Canales, dropped off [employer] card and forms to be completed.  Please call [phone] when ready to pick up.  Thank you.

## 2012-12-13 NOTE — Telephone Encounter (Signed)
Done and in IN box 

## 2012-12-13 NOTE — Telephone Encounter (Signed)
Form placed in your Inbox

## 2013-01-29 ENCOUNTER — Other Ambulatory Visit: Payer: Self-pay | Admitting: Orthopedic Surgery

## 2013-01-29 ENCOUNTER — Encounter (HOSPITAL_BASED_OUTPATIENT_CLINIC_OR_DEPARTMENT_OTHER): Payer: Self-pay | Admitting: *Deleted

## 2013-01-29 NOTE — Progress Notes (Signed)
No labs needed-splint off-hand briused and swollen-told to keep it elevated and splint on.

## 2013-01-31 ENCOUNTER — Encounter (HOSPITAL_BASED_OUTPATIENT_CLINIC_OR_DEPARTMENT_OTHER)
Admission: RE | Disposition: A | Discharge status: Home / Self Care | Payer: Self-pay | Source: Ambulatory Visit | Attending: Orthopedic Surgery

## 2013-01-31 ENCOUNTER — Encounter (HOSPITAL_BASED_OUTPATIENT_CLINIC_OR_DEPARTMENT_OTHER): Payer: Self-pay | Admitting: Anesthesiology

## 2013-01-31 ENCOUNTER — Encounter (HOSPITAL_BASED_OUTPATIENT_CLINIC_OR_DEPARTMENT_OTHER): Payer: Self-pay

## 2013-01-31 ENCOUNTER — Ambulatory Visit (HOSPITAL_BASED_OUTPATIENT_CLINIC_OR_DEPARTMENT_OTHER)
Admission: RE | Admit: 2013-01-31 | Discharge: 2013-01-31 | Disposition: A | Discharge status: Home / Self Care | Payer: BC Managed Care – PPO | Source: Ambulatory Visit | Attending: Orthopedic Surgery | Admitting: Orthopedic Surgery

## 2013-01-31 ENCOUNTER — Ambulatory Visit (HOSPITAL_BASED_OUTPATIENT_CLINIC_OR_DEPARTMENT_OTHER): Payer: BC Managed Care – PPO | Admitting: Anesthesiology

## 2013-01-31 DIAGNOSIS — IMO0002 Reserved for concepts with insufficient information to code with codable children: Secondary | ICD-10-CM | POA: Insufficient documentation

## 2013-01-31 DIAGNOSIS — I519 Heart disease, unspecified: Secondary | ICD-10-CM | POA: Insufficient documentation

## 2013-01-31 DIAGNOSIS — S62309A Unspecified fracture of unspecified metacarpal bone, initial encounter for closed fracture: Secondary | ICD-10-CM | POA: Insufficient documentation

## 2013-01-31 HISTORY — PX: OPEN REDUCTION INTERNAL FIXATION (ORIF) METACARPAL: SHX6234

## 2013-01-31 SURGERY — OPEN REDUCTION INTERNAL FIXATION (ORIF) METACARPAL
Anesthesia: General | Site: Hand | Laterality: Left | Wound class: Clean

## 2013-01-31 MED ORDER — PROPOFOL 10 MG/ML IV BOLUS
INTRAVENOUS | Status: DC | PRN
  Administered 2013-01-31: 200 mg via INTRAVENOUS

## 2013-01-31 MED ORDER — CEFAZOLIN SODIUM-DEXTROSE 2-3 GM-% IV SOLR
2.0000 g | INTRAVENOUS | Status: AC
  Administered 2013-01-31: 2 g via INTRAVENOUS

## 2013-01-31 MED ORDER — MIDAZOLAM HCL 2 MG/2ML IJ SOLN: 1.0000 mg | INTRAMUSCULAR | Status: DC | PRN

## 2013-01-31 MED ORDER — ONDANSETRON HCL 4 MG/2ML IJ SOLN
INTRAMUSCULAR | Status: DC | PRN
  Administered 2013-01-31: 4 mg via INTRAVENOUS

## 2013-01-31 MED ORDER — HYDROCODONE-ACETAMINOPHEN 7.5-325 MG PO TABS: 1.0000 | ORAL_TABLET | Freq: Four times a day (QID) | ORAL | Status: DC | PRN

## 2013-01-31 MED ORDER — HYDROMORPHONE HCL PF 1 MG/ML IJ SOLN
0.2500 mg | INTRAMUSCULAR | Status: DC | PRN
  Administered 2013-01-31 (×4): 0.5 mg via INTRAVENOUS

## 2013-01-31 MED ORDER — ACETAMINOPHEN 10 MG/ML IV SOLN
INTRAVENOUS | Status: DC | PRN
  Administered 2013-01-31: 1000 mg via INTRAVENOUS

## 2013-01-31 MED ORDER — LACTATED RINGERS IV SOLN
INTRAVENOUS | Status: DC
  Administered 2013-01-31 (×2): via INTRAVENOUS

## 2013-01-31 MED ORDER — MIDAZOLAM HCL 2 MG/2ML IJ SOLN: 0.5000 mg | Freq: Once | INTRAMUSCULAR | Status: DC | PRN

## 2013-01-31 MED ORDER — MIDAZOLAM HCL 5 MG/5ML IJ SOLN
INTRAMUSCULAR | Status: DC | PRN
  Administered 2013-01-31: 2 mg via INTRAVENOUS

## 2013-01-31 MED ORDER — CHLORHEXIDINE GLUCONATE 4 % EX LIQD: 60.0000 mL | Freq: Once | CUTANEOUS | Status: DC

## 2013-01-31 MED ORDER — BUPIVACAINE HCL (PF) 0.25 % IJ SOLN
INTRAMUSCULAR | Status: DC | PRN
  Administered 2013-01-31: 7 mL

## 2013-01-31 MED ORDER — FENTANYL CITRATE 0.05 MG/ML IJ SOLN
50.0000 ug | INTRAMUSCULAR | Status: DC | PRN
  Administered 2013-01-31: 100 ug via INTRAVENOUS

## 2013-01-31 MED ORDER — ACETAMINOPHEN 10 MG/ML IV SOLN
1000.0000 mg | Freq: Once | INTRAVENOUS | Status: AC
  Administered 2013-01-31: 1000 mg via INTRAVENOUS

## 2013-01-31 MED ORDER — LIDOCAINE HCL (CARDIAC) 20 MG/ML IV SOLN
INTRAVENOUS | Status: DC | PRN
  Administered 2013-01-31: 40 mg via INTRAVENOUS

## 2013-01-31 MED ORDER — CEFAZOLIN SODIUM-DEXTROSE 2-3 GM-% IV SOLR: 2.0000 g | INTRAVENOUS | Status: DC

## 2013-01-31 MED ORDER — MEPERIDINE HCL 25 MG/ML IJ SOLN: 6.2500 mg | INTRAMUSCULAR | Status: DC | PRN

## 2013-01-31 MED ORDER — PROMETHAZINE HCL 25 MG/ML IJ SOLN: 6.2500 mg | INTRAMUSCULAR | Status: DC | PRN

## 2013-01-31 MED ORDER — OXYCODONE HCL 5 MG/5ML PO SOLN: 5.0000 mg | Freq: Once | ORAL | Status: AC | PRN

## 2013-01-31 MED ORDER — OXYCODONE HCL 5 MG PO TABS
5.0000 mg | ORAL_TABLET | Freq: Once | ORAL | Status: AC | PRN
  Administered 2013-01-31: 5 mg via ORAL

## 2013-01-31 SURGICAL SUPPLY — 51 items
BANDAGE GAUZE ELAST BULKY 4 IN (GAUZE/BANDAGES/DRESSINGS) ×2 IMPLANT
BIT DRILL 1.0 (BIT) ×1 IMPLANT
BLADE MINI RND TIP GREEN BEAV (BLADE) IMPLANT
BLADE SURG 15 STRL LF DISP TIS (BLADE) ×1 IMPLANT
BLADE SURG 15 STRL SS (BLADE) ×1
BNDG COHESIVE 3X5 TAN STRL LF (GAUZE/BANDAGES/DRESSINGS) ×2 IMPLANT
BNDG ESMARK 4X9 LF (GAUZE/BANDAGES/DRESSINGS) ×2 IMPLANT
CHLORAPREP W/TINT 26ML (MISCELLANEOUS) ×2 IMPLANT
CLOTH BEACON ORANGE TIMEOUT ST (SAFETY) ×2 IMPLANT
CORDS BIPOLAR (ELECTRODE) ×2 IMPLANT
COVER MAYO STAND STRL (DRAPES) ×2 IMPLANT
COVER TABLE BACK 60X90 (DRAPES) ×2 IMPLANT
CUFF TOURNIQUET SINGLE 18IN (TOURNIQUET CUFF) ×2 IMPLANT
DECANTER SPIKE VIAL GLASS SM (MISCELLANEOUS) IMPLANT
DRAPE EXTREMITY T 121X128X90 (DRAPE) ×2 IMPLANT
DRAPE OEC MINIVIEW 54X84 (DRAPES) ×2 IMPLANT
DRAPE SURG 17X23 STRL (DRAPES) ×2 IMPLANT
DRILL BIT 1.0 (BIT) ×1
GAUZE XEROFORM 1X8 LF (GAUZE/BANDAGES/DRESSINGS) ×2 IMPLANT
GLOVE BIO SURGEON STRL SZ 6.5 (GLOVE) ×2 IMPLANT
GLOVE BIOGEL PI IND STRL 8.5 (GLOVE) ×1 IMPLANT
GLOVE BIOGEL PI INDICATOR 8.5 (GLOVE) ×1
GLOVE SURG ORTHO 8.0 STRL STRW (GLOVE) ×2 IMPLANT
GOWN BRE IMP PREV XXLGXLNG (GOWN DISPOSABLE) ×2 IMPLANT
GOWN PREVENTION PLUS XLARGE (GOWN DISPOSABLE) ×2 IMPLANT
NEEDLE 27GAX1X1/2 (NEEDLE) ×2 IMPLANT
NS IRRIG 1000ML POUR BTL (IV SOLUTION) ×2 IMPLANT
PACK BASIN DAY SURGERY FS (CUSTOM PROCEDURE TRAY) ×2 IMPLANT
PAD CAST 3X4 CTTN HI CHSV (CAST SUPPLIES) ×1 IMPLANT
PADDING CAST ABS 4INX4YD NS (CAST SUPPLIES)
PADDING CAST ABS COTTON 4X4 ST (CAST SUPPLIES) IMPLANT
PADDING CAST COTTON 3X4 STRL (CAST SUPPLIES) ×1
SCREW SELF TAP CORTEX 1.3 7MM (Screw) ×2 IMPLANT
SCREW SELF TAP CORTEX 1.3 8MM (Screw) ×6 IMPLANT
SCREW SELF TAP CORTEX 1.3 9MM (Screw) ×2 IMPLANT
SLEEVE SCD COMPRESS KNEE MED (MISCELLANEOUS) IMPLANT
SPLINT PLASTER CAST XFAST 3X15 (CAST SUPPLIES) IMPLANT
SPLINT PLASTER CAST XFAST 4X15 (CAST SUPPLIES) ×8 IMPLANT
SPLINT PLASTER XTRA FAST SET 4 (CAST SUPPLIES) ×8
SPLINT PLASTER XTRA FASTSET 3X (CAST SUPPLIES)
SPONGE GAUZE 4X4 12PLY (GAUZE/BANDAGES/DRESSINGS) ×2 IMPLANT
STOCKINETTE 4X48 STRL (DRAPES) ×2 IMPLANT
SUT CHROMIC 5 0 P 3 (SUTURE) IMPLANT
SUT MERSILENE 4 0 P 3 (SUTURE) IMPLANT
SUT VICRYL 4-0 PS2 18IN ABS (SUTURE) ×2 IMPLANT
SUT VICRYL RAPID 5 0 P 3 (SUTURE) IMPLANT
SUT VICRYL RAPIDE 4/0 PS 2 (SUTURE) ×2 IMPLANT
SYR BULB 3OZ (MISCELLANEOUS) ×2 IMPLANT
SYR CONTROL 10ML LL (SYRINGE) ×2 IMPLANT
TOWEL OR 17X24 6PK STRL BLUE (TOWEL DISPOSABLE) ×2 IMPLANT
UNDERPAD 30X30 INCONTINENT (UNDERPADS AND DIAPERS) ×2 IMPLANT

## 2013-01-31 NOTE — Brief Op Note (Signed)
01/31/2013  10:39 AM  PATIENT:  Bethany Herman  47 y.o. female  PRE-OPERATIVE DIAGNOSIS:  FRACTURE 5TH METACARPAL LEFT HAND  POST-OPERATIVE DIAGNOSIS:  FRACTURE 5TH METACARPAL LEFT HAND  PROCEDURE:  Procedure(s): OPEN REDUCTION INTERNAL FIXATION (ORIF) 5TH METACARPAL LEFT HAND (Left)  SURGEON:  Surgeon(s) and Role:    * Nicki Reaper, MD - Primary  PHYSICIAN ASSISTANT:   ASSISTANTS: none   ANESTHESIA:   local and general  EBL:     BLOOD ADMINISTERED:none  DRAINS: none   LOCAL MEDICATIONS USED:  MARCAINE     SPECIMEN:  No Specimen  DISPOSITION OF SPECIMEN:  N/A  COUNTS:  YES  TOURNIQUET:   Total Tourniquet Time Documented: Forearm (Left) - 53 minutes Total: Forearm (Left) - 53 minutes   DICTATION: .Other Dictation: Dictation Number [id number]  PLAN OF CARE: Discharge to home after PACU  PATIENT DISPOSITION:  PACU - hemodynamically stable.

## 2013-01-31 NOTE — Anesthesia Procedure Notes (Signed)
Procedure Name: LMA Insertion Date/Time: 01/31/2013 9:32 AM Performed by: Burna Cash Pre-anesthesia Checklist: Patient identified, Emergency Drugs available, Suction available and Patient being monitored Patient Re-evaluated:Patient Re-evaluated prior to inductionOxygen Delivery Method: Circle System Utilized Preoxygenation: Pre-oxygenation with 100% oxygen Intubation Type: IV induction Ventilation: Mask ventilation without difficulty LMA: LMA inserted LMA Size: 4.0 Number of attempts: 1 Airway Equipment and Method: bite block Placement Confirmation: positive ETCO2 Tube secured with: Tape Dental Injury: Teeth and Oropharynx as per pre-operative assessment

## 2013-01-31 NOTE — Anesthesia Postprocedure Evaluation (Signed)
  Anesthesia Post-op Note  Patient: Bethany Herman  Procedure(s) Performed: Procedure(s): OPEN REDUCTION INTERNAL FIXATION (ORIF) 5TH METACARPAL LEFT HAND (Left)  Patient Location: PACU  Anesthesia Type:General  Level of Consciousness: awake, alert , oriented and patient cooperative  Airway and Oxygen Therapy: Patient Spontanous Breathing  Post-op Pain: mild  Post-op Assessment: Post-op Vital signs reviewed, Patient's Cardiovascular Status Stable, Respiratory Function Stable, Patent Airway, No signs of Nausea or vomiting and Pain level controlled  Post-op Vital Signs: Reviewed and stable  Complications: No apparent anesthesia complications

## 2013-01-31 NOTE — Transfer of Care (Signed)
Immediate Anesthesia Transfer of Care Note  Patient: Bethany Herman  Procedure(s) Performed: Procedure(s): OPEN REDUCTION INTERNAL FIXATION (ORIF) 5TH METACARPAL LEFT HAND (Left)  Patient Location: PACU  Anesthesia Type:General  Level of Consciousness: sedated  Airway & Oxygen Therapy: Patient Spontanous Breathing and Patient connected to face mask oxygen  Post-op Assessment: Report given to PACU RN and Post -op Vital signs reviewed and stable  Post vital signs: Reviewed and stable  Complications: No apparent anesthesia complications

## 2013-01-31 NOTE — Op Note (Signed)
Dictation Number [id number]

## 2013-01-31 NOTE — H&P (Signed)
   Bethany Herman is a 47 year-old right-hand dominant female who suffered an injury to her left hand when she hit the side of a saddle. The injured occurred on 01/24/13 while in [place].  She was seen at an urgent care where x-rays were taken revealing a displaced fracture of her 5th metacarpal, spiral oblique.  She was referred to an orthopaedist in [place] who placed her in a splint. She stopped wearing this because of discomfort.  Surgical intervention was recommended.  She has no prior history of injuries, no history of diabetes, thyroid problems, arthritis or gout.   She complains of moderate pain with swelling, she has used ice and elevation.   ALLERGIES:    None.  MEDICATIONS:    None.  SURGICAL HISTORY:   Shoulder surgery by Dr. Priscille Kluver, appendectomy and apparently a ruptured appendix.    FAMILY MEDICAL HISTORY:    Positive for heart disease, high blood pressure.  SOCIAL HISTORY:     She does not smoke, drinks socially.  She is married.  She works as an Production designer, theatre/television/film.  REVIEW OF SYSTEMS:    Negative 14 points. Bethany Herman is an 47 y.o. female.   Chief Complaint: Fracture left 5th metacarpal HPI: see above  History reviewed. No pertinent past medical history.  Past Surgical History  Procedure Laterality Date  . Colonoscopy  04/19/12    normal/ re check 5 years (Dr Eli Hose had 3  . Colon surgery  2005    exp lap-colectomy benign mass  . Appendectomy  2005    with exp lap    History reviewed. No pertinent family history. Social History:  reports that she has never smoked. She does not have any smokeless tobacco history on file. She reports that  drinks alcohol. She reports that she does not use illicit drugs.  Allergies: No Known Allergies  Medications Prior to Admission  Medication Sig Dispense Refill  . diazepam (VALIUM) 5 MG tablet 1 by mouth as needed prior to airplane flight  10 tablet  0    No results found for this or any previous visit (from the past 48 hour(s)).  No  results found.   Pertinent items are noted in HPI.  Blood pressure 145/79, pulse 48, temperature 97.8 F (36.6 C), temperature source Oral, resp. rate 20, height 5\' 8"  (1.727 m), weight 59.875 kg (132 lb), last menstrual period 12/29/2012, SpO2 100.00%.  General appearance: alert, cooperative and appears stated age Head: Normocephalic, without obvious abnormality Neck: no JVD Resp: clear to auscultation bilaterally Cardio: normal apical impulse. Reg rate and rhythm GI: soft, non-tender; bowel sounds normal; no masses,  no organomegaly Extremities: extremities normal, atraumatic, no cyanosis or edema Pulses: 2+ and symmetric Skin: Skin color, texture, turgor normal. No rashes or lesions Neurologic: Grossly normal Incision/Wound: na  Assessment/Plan RADIOGRAPHS/ DIAGNOSIS:    X-rays reveal a long spiral oblique fracture of 5th metacarpal with displacement and rotation.  RECOMMENDATIONS/PLAN:   We have discussed ORIF with either plate and screws or screws alone.  The pre, peri and postoperative course were discussed along with the risks and complications.  The patient is aware there is no guarantee with the surgery, possibility of infection, recurrence, injury to arteries, nerves, tendons, incomplete relief of symptoms and dystrophy, nonunion, loss of fixation, the importance of postoperative protection. This will be scheduled as an outpatient as an outpatient under regional anesthesia.   Jolita Haefner R 01/31/2013, 9:04 AM

## 2013-01-31 NOTE — Anesthesia Preprocedure Evaluation (Addendum)
Anesthesia Evaluation  Patient identified by MRN, date of birth, ID band Patient awake    Reviewed: Allergy & Precautions, H&P , NPO status , Patient's Chart, lab work & pertinent test results  Airway Mallampati: I TM Distance: >3 FB Neck ROM: Full    Dental  (+) Teeth Intact and Dental Advisory Given   Pulmonary neg pulmonary ROS,  breath sounds clear to auscultation  Pulmonary exam normal       Cardiovascular negative cardio ROS  Rhythm:Regular Rate:Normal     Neuro/Psych negative neurological ROS     GI/Hepatic negative GI ROS, Neg liver ROS,   Endo/Other  negative endocrine ROS  Renal/GU negative Renal ROS     Musculoskeletal   Abdominal   Peds  Hematology negative hematology ROS (+)   Anesthesia Other Findings   Reproductive/Obstetrics LMP within the month                          Anesthesia Physical Anesthesia Plan  ASA: I  Anesthesia Plan: General   Post-op Pain Management:    Induction: Intravenous  Airway Management Planned: LMA  Additional Equipment:   Intra-op Plan:   Post-operative Plan:   Informed Consent: I have reviewed the patients History and Physical, chart, labs and discussed the procedure including the risks, benefits and alternatives for the proposed anesthesia with the patient or authorized representative who has indicated his/her understanding and acceptance.   Dental advisory given  Plan Discussed with: CRNA and Surgeon  Anesthesia Plan Comments: (Plan routine monitors, GA- LMA OK)        Anesthesia Quick Evaluation

## 2013-02-01 ENCOUNTER — Encounter (HOSPITAL_BASED_OUTPATIENT_CLINIC_OR_DEPARTMENT_OTHER): Payer: Self-pay | Admitting: Orthopedic Surgery

## 2013-02-01 NOTE — Op Note (Signed)
NAME:  CECILY, LAWHORNE                  ACCOUNT NO.:  [id number]  MEDICAL RECORD NO.:  [id number]  LOCATION:                                 FACILITY:  PHYSICIAN:  Cindee Salt, M.D.       DATE OF BIRTH:  October 09, 1966  DATE OF PROCEDURE:  01/31/2013 DATE OF DISCHARGE:                              OPERATIVE REPORT   PREOPERATIVE DIAGNOSIS:  Spiral oblique fracture, left little finger metacarpal.  POSTOPERATIVE DIAGNOSIS:  Spiral oblique fracture, left little finger metacarpal.  OPERATION:  Open reduction and internal fixation 5th metacarpal fracture, left hand.  SURGEON:  Cindee Salt, M.D.  ANESTHESIA:  General with local infiltration.  ANESTHESIOLOGIST:  Germaine Pomfret, M.D.  HISTORY:  The patient is a 47 year old female, who suffered an injury to her left little finger metacarpal when striking on the horn of a saddle trying to control a Scothorse.  She was seen in [place], where x-rays were taken revealing a oblique fracture with displacement.  She was oblique fracture with displacement.  She was splinted and referred.  She is seen with moderate swelling neurovascular and plan is for open reduction and internal fixation.  Pre, peri, and postoperative course have been discussed along with risks and complications.  She is aware that there is no guarantee with the surgery; possibility of infection; recurrence of injury to arteries, and nonunion of the fracture injury to nerves tendons and possibility of further surgery being necessary if loss of sensation occurs.  In the preoperative area, the patient was seen, the extremity marked by both the patient and surgeon.  Antibiotic given.  PROCEDURE:  The patient was brought to the operating room where a general anesthetic was carried out without difficulty under the direction of Dr. Jean Rosenthal.  She was prepped using ChloraPrep in supine position, left arm free.  A 3-minute dry time was allowed.  Time-out taken, confirming the patient  and procedure.  The limb was exsanguinated with an Esmarch bandage.  Tourniquet was placed and the arm was inflated to 250 mmHg.  A straight incision was made over the 5th metacarpal, left hand carried down through subcutaneous tissue.  Bleeders were electrocauterized with bipolar.  Dorsal sensory nerve was identified and protected.  Dissection was carried down to the ulnar aspect of the extensor tendons.  The periosteum incised and the periosteum elevated and the long oblique spiral fracture was noted with displacement and rotation.  This was opened cleared with a small curette of granulation tissue.  The fracture was then reduced and clamped.  X-rays confirmed positioning.  The fingers was placed through a full range motion, no rotatory component was noted, no overlap of the fingers noted.  A  1.3- mm modular screws were then selected for fixation and these were going to be done stabilization without the skull plate.  The 5 screws were placed across the fracture site measuring [id number] mm This firmly fixed the fracture in position x-rays taking AP and x[says lateral oblique revealed the fracture was not visible .  There is no angulatory or rotatory deformity was full flexion and extension of her fingers.  Wound was copiously irrigated with saline.  The  periosteum was closed with a running 4-0 Vicryl suture.  The subcutaneous tissue with interrupted 4-0 Vicryl and skin with a subcuticular 4-0 Vicryl Rapide.  Sterile compressive dressing, ulnar gutter splint to the ring and little finger applied.  On deflation of the tourniquet, all fingers immediately pinked.  She was taken to the recovery room for observation in satisfactory condition.  She will be discharged home to [facility] in 1 week.          ______________________________ Cindee Salt, M.D.     GK/MEDQ  D:  01/31/2013  T:  02/01/2013  Job:  [id number]

## 2013-10-25 DIAGNOSIS — N951 Menopausal and female climacteric states: Secondary | ICD-10-CM | POA: Insufficient documentation

## 2014-01-16 ENCOUNTER — Telehealth: Payer: Self-pay | Admitting: Family Medicine

## 2014-01-16 NOTE — Telephone Encounter (Signed)
Pt is going to have Vericose vein surgery coming up soon and wanted to ask Dr. Glori Bickers to make sure she thinks she is going to the right practice and she is needing a letter from Dr. Glori Bickers. Please advise

## 2014-01-17 NOTE — Telephone Encounter (Signed)
Where is she going/ what doctor is she seeing/ what procedure are they doing? And what kind of letter  (what does she need the letter to say?)

## 2014-01-18 NOTE — Telephone Encounter (Signed)
Pt said that her varicose veins are causing a lot of leg pain and with in the last 6-7 months the pain is getting worse at night and causing her to have restless legs so she is having a hard time sleeping too, pt is okay with picking up letter next week

## 2014-01-18 NOTE — Telephone Encounter (Signed)
I think it does sound like a good idea if her veins are causing pain and or swelling or skin ulceration  Let me know - and I will specify that in my letter for her

## 2014-01-18 NOTE — Telephone Encounter (Signed)
Pt is going to [facility]. She has bad varicose veins and has tried support hose. Pt is seeing Dr. Elza Rafter who did an ultrasound and said that she has really bad varicose veins and she needs surgery to remove them. Pt wants to make sure that you think that this is a good place, and [facility] had advise her that she may want to get a letter from you since you are her PCP saying that she has bad varicose veins and that she need medical intervention since the support hoses didn't work for her insurance because they may pay for it if her PCP agrees

## 2014-01-21 NOTE — Telephone Encounter (Signed)
Done and in the IN box  

## 2014-01-21 NOTE — Telephone Encounter (Signed)
Pt advised letter is ready to pick up.

## 2014-03-20 ENCOUNTER — Encounter: Payer: Self-pay | Admitting: Family Medicine

## 2014-03-20 ENCOUNTER — Ambulatory Visit (INDEPENDENT_AMBULATORY_CARE_PROVIDER_SITE_OTHER): Payer: BC Managed Care – PPO | Admitting: Family Medicine

## 2014-03-20 VITALS — BP 118/60 | HR 60 | Temp 97.6°F | Ht 68.0 in | Wt 127.0 lb

## 2014-03-20 DIAGNOSIS — T148 Other injury of unspecified body region: Secondary | ICD-10-CM

## 2014-03-20 DIAGNOSIS — W57XXXA Bitten or stung by nonvenomous insect and other nonvenomous arthropods, initial encounter: Secondary | ICD-10-CM | POA: Insufficient documentation

## 2014-03-20 NOTE — Progress Notes (Signed)
Pre visit review using our clinic review tool, if applicable. No additional management support is needed unless otherwise documented below in the visit note. 

## 2014-03-20 NOTE — Patient Instructions (Signed)
Tick bite looks to be healing  Alert me if any increase in redness or size/ rash/ fever/ joint pain/ headache or any other symptoms   You can try otc cort aid to reduce size and itching

## 2014-03-20 NOTE — Progress Notes (Signed)
   Subjective:    Patient ID: Bethany Herman, female    DOB: [birth year], 48 y.o.   MRN: [id number]  HPI Has a tick bite on her arm - 3 weeks ago  No symptoms / no fever/ feels fine   Still a little bump there  Never bigger than a quarter -now smaller  Never had target or bullseye   She removed tick herself - thought she got it all Very small one   Patient Active Problem List   Diagnosis Date Noted  . Routine general medical examination at a health care facility 11/24/2012   No past medical history on file. Past Surgical History  Procedure Laterality Date  . Colonoscopy  04/19/12    normal/ re check 5 years (Dr Yvetta Coder had 3  . Colon surgery  2005    exp lap-colectomy benign mass  . Appendectomy  2005    with exp lap  . Open reduction internal fixation (orif) metacarpal Left 01/31/2013    Procedure: OPEN REDUCTION INTERNAL FIXATION (ORIF) 5TH METACARPAL LEFT HAND;  Surgeon: Wynonia Sours, MD;  Location: [place];  Service: Orthopedics;  Laterality: Left;   History  Substance Use Topics  . Smoking status: Never Smoker   . Smokeless tobacco: Not on file  . Alcohol Use: Yes     Comment: social   No family history on file. No Known Allergies Current Outpatient Prescriptions on File Prior to Visit  Medication Sig Dispense Refill  . diazepam (VALIUM) 5 MG tablet 1 by mouth as needed prior to airplane flight  10 tablet  0   No current facility-administered medications on file prior to visit.    Review of Systems Review of Systems  Constitutional: Negative for fever, appetite change, fatigue and unexpected weight change.  Eyes: Negative for pain and visual disturbance.  Respiratory: Negative for cough and shortness of breath.   Cardiovascular: Negative for cp or palpitations    Gastrointestinal: Negative for nausea, diarrhea and constipation.  Genitourinary: Negative for urgency and frequency.  Skin: Negative for pallor or rash   Neurological: Negative for  weakness, light-headedness, numbness and headaches.  Hematological: Negative for adenopathy. Does not bruise/bleed easily.  Psychiatric/Behavioral: Negative for dysphoric mood. The patient is not nervous/anxious.         Objective:   Physical Exam  Constitutional: She appears well-developed and well-nourished. No distress.  HENT:  Head: Normocephalic and atraumatic.  Mouth/Throat: Oropharynx is clear and moist.  Eyes: Conjunctivae and EOM are normal. Pupils are equal, round, and reactive to light. Right eye exhibits no discharge. Left eye exhibits no discharge.  Neck: Normal range of motion. Neck supple.  Cardiovascular: Normal rate and regular rhythm.   Pulmonary/Chest: Effort normal and breath sounds normal.  Musculoskeletal: She exhibits no edema and no tenderness.  No acute joint changes   Lymphadenopathy:    She has no cervical adenopathy.  Neurological: She is alert.  Skin: Skin is warm and dry. No rash noted.  2-3 mm papule in R antecubital area-pink/ no rash or spreading erythema or bullseye pattern  Psychiatric: She has a normal mood and affect.          Assessment & Plan:

## 2014-03-21 NOTE — Assessment & Plan Note (Signed)
This looks to be benign and improving  No signs of tick fever or erythema migrans  Will watch closely however  Adv otc cortisone for itching prn

## 2014-09-26 ENCOUNTER — Other Ambulatory Visit: Payer: Self-pay

## 2015-01-03 ENCOUNTER — Ambulatory Visit (INDEPENDENT_AMBULATORY_CARE_PROVIDER_SITE_OTHER): Payer: Self-pay | Admitting: Family Medicine

## 2015-01-03 VITALS — BP 110/78 | HR 56 | Temp 97.9°F | Resp 16 | Ht 68.5 in | Wt 121.6 lb

## 2015-01-03 DIAGNOSIS — Z0000 Encounter for general adult medical examination without abnormal findings: Secondary | ICD-10-CM

## 2015-01-03 DIAGNOSIS — Z021 Encounter for pre-employment examination: Secondary | ICD-10-CM

## 2015-01-03 NOTE — Progress Notes (Signed)
° °  Subjective:    Patient ID: Bethany Herman, female    DOB: 06-20-1966, 49 y.o.   MRN: [id number] This chart was scribed for Robyn Haber, MD, by Stephania Fragmin, ED Scribe. This patient was seen in room 9 and the patient's care was started at 5:31 PM.   Chief Complaint  Patient presents with   Employment Physical    DOT    HPI HPI Comments: Bethany Herman is a 49 y.o. female who presents to the [facility] for a DOT employment physical. Patient's PCP is at [employer].  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  Patient competes in dressage, among many other activities. She owns multiple horses.  Review of Systems  A complete 10 system review of systems was obtained and all systems are negative except as noted in the HPI and PMH.      Objective:   Physical Exam  Constitutional: She is oriented to person, place, and time. She appears well-developed and well-nourished. No distress.  HENT:  Head: Normocephalic and atraumatic.  Mild cerumen buildup in left ear.  Eyes: Conjunctivae and EOM are normal.  Neck: Neck supple. No tracheal deviation present.  Cardiovascular: Normal rate.   Pulmonary/Chest: Effort normal. No respiratory distress.  Musculoskeletal: Normal range of motion.  Neurological: She is alert and oriented to person, place, and time.  Skin: Skin is warm and dry.  Psychiatric: She has a normal mood and affect. Her behavior is normal.  Nursing note and vitals reviewed. ear lavaged clear    Assessment & Plan:     This chart was scribed in my presence and reviewed by me personally.  Annual physical exam   Signed, Robyn Haber, MD

## 2016-02-09 ENCOUNTER — Encounter (HOSPITAL_COMMUNITY): Payer: Self-pay | Admitting: Emergency Medicine

## 2016-02-09 ENCOUNTER — Ambulatory Visit (HOSPITAL_COMMUNITY)
Admission: EM | Admit: 2016-02-09 | Discharge: 2016-02-09 | Disposition: A | Discharge status: Home / Self Care | Payer: BLUE CROSS/BLUE SHIELD | Attending: Family Medicine | Admitting: Family Medicine

## 2016-02-09 DIAGNOSIS — J019 Acute sinusitis, unspecified: Secondary | ICD-10-CM | POA: Diagnosis not present

## 2016-02-09 MED ORDER — AMOXICILLIN 500 MG PO CAPS: 1000.0000 mg | ORAL_CAPSULE | Freq: Two times a day (BID) | ORAL | Status: DC

## 2016-02-09 MED ORDER — PREDNISONE 20 MG PO TABS: ORAL_TABLET | ORAL | Status: DC

## 2016-02-09 NOTE — ED Notes (Signed)
Onset Friday of symptoms 4/14.  "sledge hammer" between eyes.  Throat feels like knife-like soreness.  Reports feels like everything is in head and sinus.  Stuffy head, cough and a lot of sneezing.

## 2016-02-09 NOTE — ED Provider Notes (Signed)
CSN: [id number]     Arrival date & time 02/09/16  1904 History   First MD Initiated Contact with Patient 02/09/16 2015     Chief Complaint  Patient presents with  . URI   (Consider location/radiation/quality/duration/timing/severity/associated sxs/prior Treatment) HPI Comments: 50 year old female complaining of a headache particularly pain between the eyes for 3 days. She has a sore throat only when supine.She has an Hotel manager of PND. She also believes she has had a fever. She has been taking NyQuil and ibuprofen.  Patient is a 50 y.o. female presenting with URI.  URI Presenting symptoms: congestion, fever, rhinorrhea and sore throat   Presenting symptoms: no cough, no ear pain and no fatigue   Associated symptoms: no neck pain     History reviewed. No pertinent past medical history. Past Surgical History  Procedure Laterality Date  . Colonoscopy  04/19/12    normal/ re check 5 years (Dr Yvetta Coder had 3  . Colon surgery  2005    exp lap-colectomy benign mass  . Appendectomy  2005    with exp lap  . Open reduction internal fixation (orif) metacarpal Left 01/31/2013    Procedure: OPEN REDUCTION INTERNAL FIXATION (ORIF) 5TH METACARPAL LEFT HAND;  Surgeon: Wynonia Sours, MD;  Location: [place];  Service: Orthopedics;  Laterality: Left;   No family history on file. Social History  Substance Use Topics  . Smoking status: Never Smoker   . Smokeless tobacco: None  . Alcohol Use: Yes     Comment: social   OB History    No data available     Review of Systems  Constitutional: Positive for fever and activity change. Negative for chills, appetite change and fatigue.  HENT: Positive for congestion, postnasal drip, rhinorrhea, sinus pressure and sore throat. Negative for ear pain, facial swelling and trouble swallowing.   Eyes: Negative.   Respiratory: Negative.  Negative for cough and shortness of breath.   Cardiovascular: Negative.   Gastrointestinal:  Negative.   Musculoskeletal: Negative for neck pain and neck stiffness.  Skin: Negative for pallor and rash.  Neurological: Negative.   All other systems reviewed and are negative.   Allergies  Review of patient's allergies indicates no known allergies.  Home Medications   Prior to Admission medications   Medication Sig Start Date End Date Taking? Authorizing Provider  ibuprofen (ADVIL,MOTRIN) 200 MG tablet Take 200 mg by mouth every 6 (six) hours as needed.   Yes Historical Provider, MD  Pseudoeph-Doxylamine-DM-APAP (NYQUIL PO) Take by mouth.   Yes Historical Provider, MD  amoxicillin (AMOXIL) 500 MG capsule Take 2 capsules (1,000 mg total) by mouth 2 (two) times daily. 02/09/16   Janne Napoleon, NP  predniSONE (DELTASONE) 20 MG tablet Take 3 tabs po on first day, 2 tabs second day, 2 tabs third day, 1 tab fourth day, 1 tab 5th day. Take with food. 02/09/16   Janne Napoleon, NP   Meds Ordered and Administered this Visit  Medications - No data to display  BP 119/77 mmHg  Pulse 79  Temp(Src) 101.4 F (38.6 C) (Oral)  Resp 16  SpO2 98% No data found.   Physical Exam  Constitutional: She is oriented to person, place, and time. She appears well-developed and well-nourished. No distress.  HENT:  Head: Normocephalic and atraumatic.  Mouth/Throat: No oropharyngeal exudate.  Bilateral TMs mildly retracted otherwise normal Oropharynx with minor erythema, much cobblestoning and large amount of clear PND.  Eyes: Conjunctivae and EOM are normal.  Neck: Normal  range of motion. Neck supple.  Cardiovascular: Normal rate, regular rhythm and normal heart sounds.   Pulmonary/Chest: Effort normal and breath sounds normal. No respiratory distress. She has no wheezes. She has no rales.  Musculoskeletal: Normal range of motion. She exhibits no edema.  Lymphadenopathy:    She has no cervical adenopathy.  Neurological: She is alert and oriented to person, place, and time. She exhibits normal muscle tone.   Skin: Skin is warm and dry. No rash noted.  Psychiatric: She has a normal mood and affect.  Nursing note and vitals reviewed.   ED Course  Procedures (including critical care time)  Labs Review Labs Reviewed - No data to display  Imaging Review No results found.   Visual Acuity Review  Right Eye Distance:   Left Eye Distance:   Bilateral Distance:    Right Eye Near:   Left Eye Near:    Bilateral Near:         MDM   1. Acute rhinosinusitis    Sinusitis, Adult For nasal and head congestion may take Sudafed PE 10 mg every 4 hours as needed. Saline nasal spray used frequently. For drainage may use Allegra, Claritin or Zyrtec. If you need stronger medicine to stop drainage may take Chlor-Trimeton 2-4 mg every 4 hours. This may cause drowsiness. Ibuprofen 600 mg every 6 hours as needed for pain, discomfort or fever. Drink plenty of fluids and stay well-hydrated.  The other option is to take NyQuil instead of the medicines above. With NyQuil you can take a low dose of ibuprofen 200-400 mg as well as saline nasal spray. Meds ordered this encounter  Medications  . Pseudoeph-Doxylamine-DM-APAP (NYQUIL PO)    Sig: Take by mouth.  Marland Kitchen ibuprofen (ADVIL,MOTRIN) 200 MG tablet    Sig: Take 200 mg by mouth every 6 (six) hours as needed.  Marland Kitchen amoxicillin (AMOXIL) 500 MG capsule    Sig: Take 2 capsules (1,000 mg total) by mouth 2 (two) times daily.    Dispense:  30 capsule    Refill:  0    Order Specific Question:  Supervising Provider    Answer:  Billy Fischer [phone]  . predniSONE (DELTASONE) 20 MG tablet    Sig: Take 3 tabs po on first day, 2 tabs second day, 2 tabs third day, 1 tab fourth day, 1 tab 5th day. Take with food.    Dispense:  9 tablet    Refill:  0    Order Specific Question:  Supervising Provider    Answer:  Billy Fischer [[id number]]       Janne Napoleon, NP 02/09/16 2108

## 2016-02-09 NOTE — Discharge Instructions (Signed)
Sinusitis, Adult For nasal and head congestion may take Sudafed PE 10 mg every 4 hours as needed. Saline nasal spray used frequently. For drainage may use Allegra, Claritin or Zyrtec. If you need stronger medicine to stop drainage may take Chlor-Trimeton 2-4 mg every 4 hours. This may cause drowsiness. Ibuprofen 600 mg every 6 hours as needed for pain, discomfort or fever. Drink plenty of fluids and stay well-hydrated.  The other option is to take NyQuil instead of the medicines above. With NyQuil you can take a low dose of ibuprofen 200-400 mg as well as saline nasal spray. Sinusitis is redness, soreness, and puffiness (inflammation) of the air pockets in the bones of your face (sinuses). The redness, soreness, and puffiness can cause air and mucus to get trapped in your sinuses. This can allow germs to grow and cause an infection.  HOME CARE   Drink enough fluids to keep your pee (urine) clear or pale yellow.  Use a humidifier in your home.  Run a hot shower to create steam in the bathroom. Sit in the bathroom with the door closed. Breathe in the steam 3-4 times a day.  Put a warm, moist washcloth on your face 3-4 times a day, or as told by your doctor.  Use salt water sprays (saline sprays) to wet the thick fluid in your nose. This can help the sinuses drain.  Only take medicine as told by your doctor. GET HELP RIGHT AWAY IF:   Your pain gets worse.  You have very bad headaches.  You are sick to your stomach (nauseous).  You throw up (vomit).  You are very sleepy (drowsy) all the time.  Your face is puffy (swollen).  Your vision changes.  You have a stiff neck.  You have trouble breathing. MAKE SURE YOU:   Understand these instructions.  Will watch your condition.  Will get help right away if you are not doing well or get worse.   This information is not intended to replace advice given to you by your health care provider. Make sure you discuss any questions you  have with your health care provider.   Document Released: 03/29/2008 Document Revised: 11/01/2014 Document Reviewed: 05/16/2012 Elsevier Interactive Patient Education 2016 Elsevier Inc.  Sinus Rinse WHAT IS A SINUS RINSE? A sinus rinse is a simple home treatment that is used to rinse your sinuses with a sterile mixture of salt and water (saline solution). Sinuses are air-filled spaces in your skull behind the bones of your face and forehead that open into your nasal cavity. You will use the following:  Saline solution.  Neti pot or spray bottle. This releases the saline solution into your nose and through your sinuses. Neti pots and spray bottles can be purchased at [employer], a health food store, or online. WHEN WOULD I DO A SINUS RINSE? A sinus rinse can help to clear mucus, dirt, dust, or pollen from the nasal cavity. You may do a sinus rinse when you have a cold, a virus, nasal allergy symptoms, a sinus infection, or stuffiness in the nose or sinuses. If you are considering a sinus rinse:  Ask your child's health care provider before performing a sinus rinse on your child.  Do not do a sinus rinse if you have had ear or nasal surgery, ear infection, or blocked ears. HOW DO I DO A SINUS RINSE?  Wash your hands.  Disinfect your device according to the directions provided and then dry it.  Use the  solution that comes with your device or one that is sold separately in stores. Follow the mixing directions on the package.  Fill your device with the amount of saline solution as directed by the device instructions.  Stand over a sink and tilt your head sideways over the sink.  Place the spout of the device in your upper nostril (the one closer to the ceiling).  Gently pour or squeeze the saline solution into the nasal cavity. The liquid should drain to the lower nostril if you are not overly congested.  Gently blow your nose. Blowing too hard may cause ear pain.  Repeat  in the other nostril.  Clean and rinse your device with clean water and then air-dry it. ARE THERE RISKS OF A SINUS RINSE?  Sinus rinse is generally very safe and effective. However, there are a few risks, which include:   A burning sensation in the sinuses. This may happen if you do not make the saline solution as directed. Make sure to follow all directions when making the saline solution.  Infection from contaminated water. This is rare, but possible.  Nasal irritation.   This information is not intended to replace advice given to you by your health care provider. Make sure you discuss any questions you have with your health care provider.   Document Released: 05/08/2014 Document Reviewed: 05/08/2014 Elsevier Interactive Patient Education [employer].

## 2016-06-01 DIAGNOSIS — Z01419 Encounter for gynecological examination (general) (routine) without abnormal findings: Secondary | ICD-10-CM | POA: Diagnosis not present

## 2016-06-01 DIAGNOSIS — Z681 Body mass index (BMI) 19 or less, adult: Secondary | ICD-10-CM | POA: Diagnosis not present

## 2016-06-01 DIAGNOSIS — Z1231 Encounter for screening mammogram for malignant neoplasm of breast: Secondary | ICD-10-CM | POA: Diagnosis not present

## 2016-12-01 ENCOUNTER — Ambulatory Visit (INDEPENDENT_AMBULATORY_CARE_PROVIDER_SITE_OTHER): Payer: BLUE CROSS/BLUE SHIELD | Admitting: Physician Assistant

## 2016-12-01 ENCOUNTER — Ambulatory Visit (INDEPENDENT_AMBULATORY_CARE_PROVIDER_SITE_OTHER): Payer: Self-pay | Admitting: Physician Assistant

## 2016-12-01 VITALS — BP 122/72 | HR 59 | Temp 97.7°F | Resp 17 | Ht 69.0 in | Wt 127.0 lb

## 2016-12-01 DIAGNOSIS — Z23 Encounter for immunization: Secondary | ICD-10-CM

## 2016-12-01 DIAGNOSIS — Z0289 Encounter for other administrative examinations: Secondary | ICD-10-CM

## 2016-12-01 DIAGNOSIS — W57XXXA Bitten or stung by nonvenomous insect and other nonvenomous arthropods, initial encounter: Secondary | ICD-10-CM | POA: Diagnosis not present

## 2016-12-01 MED ORDER — TRIAMCINOLONE ACETONIDE 0.1 % EX CREA: 1.0000 "application " | TOPICAL_CREAM | Freq: Two times a day (BID) | CUTANEOUS | 0 refills | Status: DC

## 2016-12-01 NOTE — Patient Instructions (Signed)
Bedbugs Introduction Bedbugs are tiny bugs that live in and around beds. During the day, they stay hidden. At night, they come out and bite. Where are bedbugs found? Bedbugs can be found anywhere. It does not matter if a place is clean or dirty. They are often found in:  Hotels.  Shelters.  Dorms.  Hospitals.  Nursing homes.  Places where there are many birds or bats. What are bedbug bites like? A bedbug bite leaves a small red bump with a darker red dot in the middle. The bump may show up soon after a person is bitten or a day or more later. Bedbug bites usually do not hurt, but they may itch. Most people do not need treatment for bedbug bites. The bumps usually go away on their own in a few days. How do I check for bedbugs? Bedbugs are reddish-brown, oval, and flat. They are very small and they cannot fly. Look for bedbugs in these places:  On mattresses, bed frames, headboards, and box springs.  On drapes and curtains in bedrooms.  Under the carpet in bedrooms.  Behind electrical outlets.  Behind any wallpaper that is peeling.  Inside luggage. Also look for black or red spots or stains on or near the bed. What should I do if I find bedbugs? When Traveling  Check your clothes, suitcase, and belongings for bedbugs before you go back home. You may want to throw away anything that has bedbugs on it. At Home  Your bedroom may need to be treated by a pest control expert. You may also need to throw away mattresses or luggage. To help keep bedbugs from coming back, you may want to:  Put a plastic cover over your mattress.  Wash your clothes and bedding in water that is hotter than 120F (48.9C). Dry them on a hot setting.  Vacuum often around the bed and in all of the cracks where the bugs might hide.  Check all used furniture, bedding, or clothes that you bring into your home.  Get rid of bird nests and bat roosts that are near your home. In Your Bed  Try wearing  pajamas that have long sleeves and pant legs. Bedbugs usually bite areas of the skin that are not covered. This information is not intended to replace advice given to you by your health care provider. Make sure you discuss any questions you have with your health care provider. Document Released: 01/26/2011 Document Revised: 03/18/2016 Document Reviewed: 10/07/2014  2017 Elsevier

## 2016-12-01 NOTE — Patient Instructions (Signed)
     IF you received an x-ray today, you will receive an invoice from [facility]. Please contact [facility] at [phone] with questions or concerns regarding your invoice.   IF you received labwork today, you will receive an invoice from [facility]. Please contact [facility] at [phone] with questions or concerns regarding your invoice.   Our billing staff will not be able to assist you with questions regarding bills from these companies.  You will be contacted with the lab results as soon as they are available. The fastest way to get your results is to activate your My Chart account. Instructions are located on the last page of this paperwork. If you have not heard from us regarding the results in 2 weeks, please contact this office.     

## 2016-12-02 ENCOUNTER — Ambulatory Visit: Payer: Self-pay

## 2016-12-05 ENCOUNTER — Encounter: Payer: Self-pay | Admitting: Physician Assistant

## 2016-12-05 NOTE — Progress Notes (Signed)
[facility] [address] [place] [address] [phone]  Date:  12/01/2016   Name:  Bethany Herman   DOB:  01-03-1966   MRN:  [id number]  PCP:  Bethany Pardon, MD    History of Present Illness:  Bethany Herman is a 51 y.o. female patient who presents to [facility] for cc of dot. No concerns or complaints at this time in relation to DOT.   Patient Active Problem List   Diagnosis Date Noted  . Tick bite 03/20/2014  . Routine general medical examination at a health care facility 11/24/2012    No past medical history on file.  Past Surgical History:  Procedure Laterality Date  . APPENDECTOMY  2005   with exp lap  . COLON SURGERY  2005   exp lap-colectomy benign mass  . COLONOSCOPY  04/19/12   normal/ re check 5 years (Dr Yvetta Coder had 3  . OPEN REDUCTION INTERNAL FIXATION (ORIF) METACARPAL Left 01/31/2013   Procedure: OPEN REDUCTION INTERNAL FIXATION (ORIF) 5TH METACARPAL LEFT HAND;  Surgeon: Wynonia Sours, MD;  Location: [place];  Service: Orthopedics;  Laterality: Left;    Social History  Substance Use Topics  . Smoking status: Never Smoker  . Smokeless tobacco: Never Used  . Alcohol use Yes     Comment: social    No family history on file.  No Known Allergies  Medication list has been reviewed and updated.  No current outpatient prescriptions on file prior to visit.   No current facility-administered medications on file prior to visit.     Review of Systems  Constitutional: Negative for chills and fever.  HENT: Negative for ear discharge, ear pain and sore throat.   Eyes: Negative for blurred vision and double vision.  Respiratory: Negative for cough, shortness of breath and wheezing.   Cardiovascular: Negative for chest pain, palpitations and leg swelling.  Gastrointestinal: Negative for diarrhea, nausea and vomiting.  Genitourinary: Negative for dysuria, frequency and hematuria.  Skin: Negative for itching.  Neurological:  Negative for dizziness and headaches.     Physical Examination: BP 122/72 (BP Location: Right Arm, Patient Position: Sitting, Cuff Size: Normal)   Pulse (!) 59   Temp 97.7 F (36.5 C) (Oral)   Resp 17   Ht 5\' 9"  (1.753 m)   Wt 127 lb (57.6 kg)   SpO2 99%   BMI 18.75 kg/m  Ideal Body Weight: Weight in (lb) to have BMI = 25: 168.9  Physical Exam  Constitutional: She is oriented to person, place, and time. She appears well-developed and well-nourished. No distress.  HENT:  Head: Normocephalic and atraumatic.  Right Ear: Tympanic membrane, external ear and ear canal normal.  Left Ear: Tympanic membrane, external ear and ear canal normal.  Nose: Right sinus exhibits no maxillary sinus tenderness and no frontal sinus tenderness. Left sinus exhibits no maxillary sinus tenderness and no frontal sinus tenderness.  Mouth/Throat: Oropharynx is clear and moist. No uvula swelling. No oropharyngeal exudate, posterior oropharyngeal edema or posterior oropharyngeal erythema.  Eyes: Conjunctivae and EOM are normal. Pupils are equal, round, and reactive to light.  Neck: Normal range of motion. Neck supple. No thyromegaly present.  Cardiovascular: Normal rate, regular rhythm, normal heart sounds and intact distal pulses.  Exam reveals no gallop, no distant heart sounds and no friction rub.   No murmur heard. Pulmonary/Chest: Effort normal and breath sounds normal. No respiratory distress. She has no decreased breath sounds. She has no  wheezes. She has no rhonchi.  Abdominal: Soft. Bowel sounds are normal. She exhibits no distension and no mass. There is no tenderness.  Musculoskeletal: Normal range of motion. She exhibits no edema or tenderness.  Lymphadenopathy:       Head (right side): No submandibular, no tonsillar, no preauricular and no posterior auricular adenopathy present.       Head (left side): No submandibular, no tonsillar, no preauricular and no posterior auricular adenopathy present.     She has no cervical adenopathy.  Neurological: She is alert and oriented to person, place, and time. No cranial nerve deficit. She exhibits normal muscle tone. Coordination normal.  Skin: Skin is warm and dry. She is not diaphoretic.  Psychiatric: She has a normal mood and affect. Her behavior is normal.     Assessment and Plan: Bethany Herman is a 51 y.o. 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 Encounter for examination required by Department of Transportation (DOT)  Bethany Herman, Bethany Herman [facility] [place] 2/11/20187:20 PM

## 2016-12-05 NOTE — Progress Notes (Signed)
[facility] [address] [place] [address] [phone]  Date:  12/01/2016   Name:  Bethany Herman   DOB:  08/30/1966   MRN:  [id number]  PCP:  Loura Pardon, MD    History of Present Illness:  Bethany Herman is a 51 y.o. female patient who presents to [facility] for cc of rash. Couple of weeks of rash along her back that are pruritic.  They had worsened but now are just present.  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  No fever.  Just along the back.  She has not seen any along her wrist, back of knees, webs of hands and feet.  No drainage.      Patient Active Problem List   Diagnosis Date Noted  . Tick bite 03/20/2014  . Routine general medical examination at a health care facility 11/24/2012    No past medical history on file.  Past Surgical History:  Procedure Laterality Date  . APPENDECTOMY  2005   with exp lap  . COLON SURGERY  2005   exp lap-colectomy benign mass  . COLONOSCOPY  04/19/12   normal/ re check 5 years (Dr Yvetta Coder had 3  . OPEN REDUCTION INTERNAL FIXATION (ORIF) METACARPAL Left 01/31/2013   Procedure: OPEN REDUCTION INTERNAL FIXATION (ORIF) 5TH METACARPAL LEFT HAND;  Surgeon: Wynonia Sours, MD;  Location: [place];  Service: Orthopedics;  Laterality: Left;    Social History  Substance Use Topics  . Smoking status: Never Smoker  . Smokeless tobacco: Never Used  . Alcohol use Yes     Comment: social    No family history on file.  No Known Allergies  Medication list has been reviewed and updated.  No current outpatient prescriptions on file prior to visit.   No current facility-administered medications on file prior to visit.     ROS ROS otherwise unremarkable unless listed above.   Physical Examination: There were no vitals taken for this visit. Ideal Body Weight:    Physical Exam  Constitutional: She is oriented to  person, place, and time. She appears well-developed and well-nourished. No distress.  HENT:  Head: Normocephalic and atraumatic.  Right Ear: External ear normal.  Left Ear: External ear normal.  Eyes: Conjunctivae and EOM are normal. Pupils are equal, round, and reactive to light.  Cardiovascular: Normal rate and regular rhythm.  Exam reveals no friction rub.   No murmur heard. Pulmonary/Chest: Effort normal. No respiratory distress. She has no wheezes.  Neurological: She is alert and oriented to person, place, and time.  Skin: She is not diaphoretic.  Erythematous papules along the posterior thoracic in linear formation.  Hemorrhagic punctums are present on these linear areas along her back. Some excoriation also prominent.  Psychiatric: She has a normal mood and affect. Her behavior is normal.     Assessment and Plan: [name] is a 51 y.o. female who is here today for rash and flu vaccine. Advised heavy cleaning and washing linens and checking luggage.   Follow up if no improvement.   Flu vaccine given today.  Bedbug bite, initial encounter - Plan: triamcinolone cream (KENALOG) 0.1 %  Flu vaccine need - Plan: Flu Vaccine QUAD 36+ mos IM  Ivar Drape, PA-C [facility] 2/11/20186:56 PM

## 2016-12-13 ENCOUNTER — Emergency Department (HOSPITAL_COMMUNITY): Payer: BLUE CROSS/BLUE SHIELD

## 2016-12-13 ENCOUNTER — Emergency Department (HOSPITAL_COMMUNITY)
Admission: EM | Admit: 2016-12-13 | Discharge: 2016-12-13 | Disposition: A | Discharge status: Home / Self Care | Payer: BLUE CROSS/BLUE SHIELD | Attending: Emergency Medicine | Admitting: Emergency Medicine

## 2016-12-13 ENCOUNTER — Telehealth: Payer: Self-pay | Admitting: Family Medicine

## 2016-12-13 ENCOUNTER — Encounter (HOSPITAL_COMMUNITY): Payer: Self-pay | Admitting: Nurse Practitioner

## 2016-12-13 DIAGNOSIS — R0781 Pleurodynia: Secondary | ICD-10-CM | POA: Diagnosis not present

## 2016-12-13 DIAGNOSIS — R0789 Other chest pain: Secondary | ICD-10-CM | POA: Diagnosis not present

## 2016-12-13 DIAGNOSIS — R079 Chest pain, unspecified: Secondary | ICD-10-CM | POA: Diagnosis not present

## 2016-12-13 LAB — BASIC METABOLIC PANEL
ANION GAP: 10 (ref 5–15)
BUN: 10 mg/dL (ref 6–20)
CALCIUM: 9.5 mg/dL (ref 8.9–10.3)
CO2: 26 mmol/L (ref 22–32)
Chloride: 104 mmol/L (ref 101–111)
Creatinine, Ser: 0.83 mg/dL (ref 0.44–1.00)
GFR calc Af Amer: >60 mL/min (ref >60)
GLUCOSE: 89 mg/dL (ref 65–99)
Potassium: 4.8 mmol/L (ref 3.5–5.1)
SODIUM: 140 mmol/L (ref 135–145)

## 2016-12-13 LAB — CBC
HCT: 41.5 % (ref 36.0–46.0)
HEMOGLOBIN: 13.3 g/dL (ref 12.0–15.0)
MCH: 30.6 pg (ref 26.0–34.0)
MCHC: 32 g/dL (ref 30.0–36.0)
MCV: 95.6 fL (ref 78.0–100.0)
Platelets: 229 10*3/uL (ref 150–400)
RBC: 4.34 MIL/uL (ref 3.87–5.11)
RDW: 13 % (ref 11.5–15.5)
WBC: 4.9 10*3/uL (ref 4.0–10.5)

## 2016-12-13 LAB — I-STAT TROPONIN, ED: TROPONIN I, POC: 0 ng/mL (ref 0.00–0.08)

## 2016-12-13 LAB — D-DIMER, QUANTITATIVE: D-Dimer, Quant: 0.39 ug/mL-FEU (ref 0.00–0.50)

## 2016-12-13 NOTE — ED Notes (Signed)
ED Provider at bedside. 

## 2016-12-13 NOTE — ED Notes (Signed)
Papers reviewed with patient and she verbalizes understanding and intent to follow up if anything gets worse

## 2016-12-13 NOTE — Telephone Encounter (Signed)
Patient currently at ER for evaluation.

## 2016-12-13 NOTE — Discharge Instructions (Signed)
Your exam and symptoms are most consistent with pain in the chest wall, which is muscle strain. Take Tylenol or Advil as directed for pain. You can also hold an ice pack over the painful area 4 times daily for 30 minutes at a time. Avoid heavy lifting until pain resolves. Follow up with Dr. Glori Bickers if not better in a week. Return if concern for any reason

## 2016-12-13 NOTE — ED Notes (Signed)
Called lab added D-dimer

## 2016-12-13 NOTE — ED Triage Notes (Signed)
Pt presents with c/o cp. The pain began about 2 weeks ago under her left breast and radiating into her mid chest. The pain has been intermittent since onset and increasing in frequency. The pain has felt like a cramping, heat,  and pressure at different times. The pain resolves after a few minutes. She reports feeling fatigued, dizzy, sob today.

## 2016-12-13 NOTE — Telephone Encounter (Signed)
Patient Name: Bethany Herman DOB: Apr 23, 1966 Initial Comment Caller is having some strange chest pain Nurse Assessment Nurse: Jimmye Norman, RN, [place] Date/Time (Eastern Time): 12/13/2016 12:55:09 PM Confirm and document reason for call. If symptomatic, describe symptoms. ---Caller is having some strange chest pain that started about 2 weeks ago and seems like it is getting worse. seems like a pulled muscle, she is very physically active. no known injury. feels fine other than that. Does the patient have any new or worsening symptoms? ---Yes Will a triage be completed? ---Yes Related visit to physician within the last 2 weeks? ---No Does the PT have any chronic conditions? (i.e. diabetes, asthma, etc.) ---No Is the patient pregnant or possibly pregnant? (Ask all females between the ages of 49-55) ---No Is this a behavioral health or substance abuse call? ---No Guidelines Guideline Title Affirmed Question Affirmed Notes Chest Pain [1] Intermittent chest pain or "angina" AND [2] increasing in severity or frequency (Exception: pains lasting a few seconds) Final Disposition User Go to ED Now Jimmye Norman, RN, [place] Referrals [facility] - ED Disagree/Comply: Comply

## 2016-12-13 NOTE — Telephone Encounter (Signed)
Will watch for records 

## 2016-12-13 NOTE — ED Provider Notes (Signed)
[facility] DEPT Provider Note   CSN: [id number] Arrival date & time: 12/13/16  1344     History   Chief Complaint Chief Complaint  Patient presents with  . Chest Pain    HPI Calandra Bonus Trindle is a 51 y.o. female.HPI Complains of left anterior chest pain onset 2 weeks ago points to left chest anterior axillary line, and parsley 5 cm diameter area pain is pleuritic and exacerbated by deep inspiration improved by shallow breathing. She denies shortness of breath she did have slight nausea today pain is been constant since awakening this morning pain is also exacerbated by position change. Nonexertional. No fatigue No other associated symptoms. Treated with aspirin a few times in the past 2 weeks, none today. Patient exercises regularly and pain is not exacerbated by exercise or walking History reviewed. No pertinent past medical history.  Patient Active Problem List   Diagnosis Date Noted  . Tick bite 03/20/2014  . Routine general medical examination at a health care facility 11/24/2012    Past Surgical History:  Procedure Laterality Date  . APPENDECTOMY  2005   with exp lap  . COLON SURGERY  2005   exp lap-colectomy benign mass  . COLONOSCOPY  04/19/12   normal/ re check 5 years (Dr Yvetta Coder had 3  . OPEN REDUCTION INTERNAL FIXATION (ORIF) METACARPAL Left 01/31/2013   Procedure: OPEN REDUCTION INTERNAL FIXATION (ORIF) 5TH METACARPAL LEFT HAND;  Surgeon: Wynonia Sours, MD;  Location: [place];  Service: Orthopedics;  Laterality: Left;    OB History    No data available       Home Medications    Prior to Admission medications   Medication Sig Start Date End Date Taking? Authorizing Provider  triamcinolone cream (KENALOG) 0.1 % Apply 1 application topically 2 (two) times daily. 12/01/16  Yes Joretta Bachelor, PA    Family History History reviewed. No pertinent family history. Father had MI age 66 multiple family members with hypertension Social  History Social History  Substance Use Topics  . Smoking status: Never Smoker  . Smokeless tobacco: Never Used  . Alcohol use Yes     Comment: social  No illicit drug use   Allergies   Patient has no known allergies.   Review of Systems Review of Systems  Constitutional: Negative.   HENT: Negative.   Respiratory: Negative.   Cardiovascular: Positive for chest pain.  Gastrointestinal: Positive for nausea.  Musculoskeletal: Negative.   Skin: Negative.   Neurological: Negative.   Psychiatric/Behavioral: Negative.   All other systems reviewed and are negative.    Physical Exam Updated Vital Signs BP 117/79   Pulse (!) 57   Temp 98.1 F (36.7 C) (Oral)   Resp 14   SpO2 100%   Physical Exam  Constitutional: She appears well-developed and well-nourished.  HENT:  Head: Normocephalic and atraumatic.  Eyes: Conjunctivae are normal. Pupils are equal, round, and reactive to light.  Neck: Neck supple. No tracheal deviation present. No thyromegaly present.  Cardiovascular: Normal rate and regular rhythm.   No murmur heard. Pulmonary/Chest: Effort normal and breath sounds normal. She exhibits tenderness.  Left anterior chest tender, reproducing pain exactly  Abdominal: Soft. Bowel sounds are normal. She exhibits no distension. There is no tenderness.  Musculoskeletal: Normal range of motion. She exhibits no edema or tenderness.  Neurological: She is alert. Coordination normal.  Skin: Skin is warm and dry. No rash noted.  Psychiatric: She has a normal mood and affect.  Nursing  note and vitals reviewed.    ED Treatments / Results  Labs (all labs ordered are listed, but only abnormal results are displayed) Labs Reviewed  BASIC METABOLIC PANEL  CBC  D-DIMER, QUANTITATIVE (NOT AT [facility])  I-STAT TROPOININ, ED    EKG  EKG Interpretation  Date/Time:  Monday December 13 2016 13:55:52 EST Ventricular Rate:  62 PR Interval:  134 QRS Duration: 76 QT Interval:  416 QTC  Calculation: 422 R Axis:   85 Text Interpretation:  Normal sinus rhythm Normal ECG No significant change since last tracing Confirmed by Winfred Leeds  MD, Jatavian Calica [phone]) on 12/13/2016 5:18:24 PM      Chest x-ray viewed by me Results for orders placed or performed during the hospital encounter of [id number]  Basic metabolic panel  Result Value Ref Range   Sodium 140 135 - 145 mmol/L   Potassium 4.8 3.5 - 5.1 mmol/L   Chloride 104 101 - 111 mmol/L   CO2 26 22 - 32 mmol/L   Glucose, Bld 89 65 - 99 mg/dL   BUN 10 6 - 20 mg/dL   Creatinine, Ser 0.83 0.44 - 1.00 mg/dL   Calcium 9.5 8.9 - 10.3 mg/dL   GFR calc non Af Amer >60 >60 mL/min   GFR calc Af Amer >60 >60 mL/min   Anion gap 10 5 - 15  CBC  Result Value Ref Range   WBC 4.9 4.0 - 10.5 K/uL   RBC 4.34 3.87 - 5.11 MIL/uL   Hemoglobin 13.3 12.0 - 15.0 g/dL   HCT 41.5 36.0 - 46.0 %   MCV 95.6 78.0 - 100.0 fL   MCH 30.6 26.0 - 34.0 pg   MCHC 32.0 30.0 - 36.0 g/dL   RDW 13.0 11.5 - 15.5 %   Platelets 229 150 - 400 K/uL  D-dimer, quantitative (not at [facility])  Result Value Ref Range   D-Dimer, Quant 0.39 0.00 - 0.50 ug/mL-FEU  I-stat troponin, ED  Result Value Ref Range   Troponin i, poc 0.00 0.00 - 0.08 ng/mL   Comment 3           Dg Chest 2 View  Result Date: 12/13/2016 CLINICAL DATA:  51 y/o  F; chest pain. EXAM: CHEST  2 VIEW COMPARISON:  None. FINDINGS: The heart size and mediastinal contours are within normal limits. Both lungs are clear. No acute osseous abnormality identified. Sclerosis within the superolateral right proximal humerus, question prior shoulder dislocation. IMPRESSION: No active cardiopulmonary disease. Electronically Signed   By: Kristine Garbe M.D.   On: 12/13/2016 14:34   Radiology Dg Chest 2 View  Result Date: 12/13/2016 CLINICAL DATA:  51 y/o  F; chest pain. EXAM: CHEST  2 VIEW COMPARISON:  None. FINDINGS: The heart size and mediastinal contours are within normal limits. Both lungs are clear. No acute  osseous abnormality identified. Sclerosis within the superolateral right proximal humerus, question prior shoulder dislocation. IMPRESSION: No active cardiopulmonary disease. Electronically Signed   By: Kristine Garbe M.D.   On: 12/13/2016 14:34    Procedures Procedures (including critical care time)  Medications Ordered in ED Medications - No data to display   Initial Impression / Assessment and Plan / ED Course  I have reviewed the triage vital signs and the nursing notes.  Pertinent labs & imaging results that were available during my care of the patient were reviewed by me and considered in my medical decision making (see chart for details).     6:45 PM patient resting comfortably. No  distress. Symptoms and exam consistent with chest wall pain is most likely culprit. Low pretest critical probability for pulmonary embolism. Negative d-dimer. Heart score equals 2. Plan Tylenol or Advil for pain. Follow-up with PMD if not better in a week.  Final Clinical Impressions(s) / ED Diagnoses   diagnosis atypical chest pain  Final diagnoses:  None    New Prescriptions New Prescriptions   No medications on file     Orlie Dakin, MD 12/13/16 1850

## 2017-05-12 DIAGNOSIS — D126 Benign neoplasm of colon, unspecified: Secondary | ICD-10-CM | POA: Diagnosis not present

## 2017-05-12 DIAGNOSIS — Z800 Family history of malignant neoplasm of digestive organs: Secondary | ICD-10-CM | POA: Diagnosis not present

## 2017-05-12 DIAGNOSIS — Z85038 Personal history of other malignant neoplasm of large intestine: Secondary | ICD-10-CM | POA: Diagnosis not present

## 2017-05-12 DIAGNOSIS — K640 First degree hemorrhoids: Secondary | ICD-10-CM | POA: Diagnosis not present

## 2017-05-16 DIAGNOSIS — D126 Benign neoplasm of colon, unspecified: Secondary | ICD-10-CM | POA: Diagnosis not present

## 2017-06-30 DIAGNOSIS — D485 Neoplasm of uncertain behavior of skin: Secondary | ICD-10-CM | POA: Diagnosis not present

## 2017-06-30 DIAGNOSIS — D1801 Hemangioma of skin and subcutaneous tissue: Secondary | ICD-10-CM | POA: Diagnosis not present

## 2017-06-30 DIAGNOSIS — L308 Other specified dermatitis: Secondary | ICD-10-CM | POA: Diagnosis not present

## 2017-06-30 DIAGNOSIS — L821 Other seborrheic keratosis: Secondary | ICD-10-CM | POA: Diagnosis not present

## 2017-06-30 DIAGNOSIS — L819 Disorder of pigmentation, unspecified: Secondary | ICD-10-CM | POA: Diagnosis not present

## 2017-06-30 DIAGNOSIS — L988 Other specified disorders of the skin and subcutaneous tissue: Secondary | ICD-10-CM | POA: Diagnosis not present

## 2017-06-30 DIAGNOSIS — D229 Melanocytic nevi, unspecified: Secondary | ICD-10-CM | POA: Diagnosis not present

## 2017-07-05 DIAGNOSIS — Z1231 Encounter for screening mammogram for malignant neoplasm of breast: Secondary | ICD-10-CM | POA: Diagnosis not present

## 2017-07-05 DIAGNOSIS — Z681 Body mass index (BMI) 19 or less, adult: Secondary | ICD-10-CM | POA: Diagnosis not present

## 2017-07-05 DIAGNOSIS — Z124 Encounter for screening for malignant neoplasm of cervix: Secondary | ICD-10-CM | POA: Diagnosis not present

## 2017-07-05 DIAGNOSIS — Z01419 Encounter for gynecological examination (general) (routine) without abnormal findings: Secondary | ICD-10-CM | POA: Diagnosis not present

## 2017-12-29 DIAGNOSIS — D2262 Melanocytic nevi of left upper limb, including shoulder: Secondary | ICD-10-CM | POA: Diagnosis not present

## 2017-12-29 DIAGNOSIS — L814 Other melanin hyperpigmentation: Secondary | ICD-10-CM | POA: Diagnosis not present

## 2017-12-29 DIAGNOSIS — D2261 Melanocytic nevi of right upper limb, including shoulder: Secondary | ICD-10-CM | POA: Diagnosis not present

## 2017-12-29 DIAGNOSIS — D1801 Hemangioma of skin and subcutaneous tissue: Secondary | ICD-10-CM | POA: Diagnosis not present

## 2018-07-21 DIAGNOSIS — Z1231 Encounter for screening mammogram for malignant neoplasm of breast: Secondary | ICD-10-CM | POA: Diagnosis not present

## 2018-07-21 DIAGNOSIS — Z681 Body mass index (BMI) 19 or less, adult: Secondary | ICD-10-CM | POA: Diagnosis not present

## 2018-07-21 DIAGNOSIS — Z01419 Encounter for gynecological examination (general) (routine) without abnormal findings: Secondary | ICD-10-CM | POA: Diagnosis not present

## 2018-12-06 ENCOUNTER — Telehealth: Payer: Self-pay

## 2018-12-06 NOTE — Telephone Encounter (Signed)
Pt called to get date of last tetanus shot; advised pt last Tdap was 11/24/2012. Pt voiced understanding; nothing further needed.

## 2018-12-28 DIAGNOSIS — L309 Dermatitis, unspecified: Secondary | ICD-10-CM | POA: Diagnosis not present

## 2018-12-28 DIAGNOSIS — R6889 Other general symptoms and signs: Secondary | ICD-10-CM | POA: Diagnosis not present

## 2018-12-28 DIAGNOSIS — N951 Menopausal and female climacteric states: Secondary | ICD-10-CM | POA: Diagnosis not present

## 2019-06-14 DIAGNOSIS — H524 Presbyopia: Secondary | ICD-10-CM | POA: Diagnosis not present

## 2019-06-14 DIAGNOSIS — H2513 Age-related nuclear cataract, bilateral: Secondary | ICD-10-CM | POA: Diagnosis not present

## 2019-06-14 DIAGNOSIS — H04123 Dry eye syndrome of bilateral lacrimal glands: Secondary | ICD-10-CM | POA: Diagnosis not present

## 2019-06-14 DIAGNOSIS — H25013 Cortical age-related cataract, bilateral: Secondary | ICD-10-CM | POA: Diagnosis not present

## 2019-08-23 DIAGNOSIS — Z124 Encounter for screening for malignant neoplasm of cervix: Secondary | ICD-10-CM | POA: Diagnosis not present

## 2019-08-23 DIAGNOSIS — Z01419 Encounter for gynecological examination (general) (routine) without abnormal findings: Secondary | ICD-10-CM | POA: Diagnosis not present

## 2019-08-23 DIAGNOSIS — Z1231 Encounter for screening mammogram for malignant neoplasm of breast: Secondary | ICD-10-CM | POA: Diagnosis not present

## 2020-05-08 ENCOUNTER — Other Ambulatory Visit: Payer: Self-pay

## 2020-05-08 ENCOUNTER — Ambulatory Visit (INDEPENDENT_AMBULATORY_CARE_PROVIDER_SITE_OTHER): Payer: BC Managed Care – PPO | Admitting: Otolaryngology

## 2020-05-08 ENCOUNTER — Encounter (INDEPENDENT_AMBULATORY_CARE_PROVIDER_SITE_OTHER): Payer: Self-pay | Admitting: Otolaryngology

## 2020-05-08 VITALS — Temp 97.5°F

## 2020-05-08 DIAGNOSIS — J310 Chronic rhinitis: Secondary | ICD-10-CM | POA: Diagnosis not present

## 2020-05-08 NOTE — Progress Notes (Signed)
HPI: Bethany Herman is a 54 y.o. female who presents for evaluation of sinus issues.  She developed a sinus infection on June 3 following working in the yard on grass and weeds at her mother's house.  A couple days following this she developed a burning sensation in her nose.  Her eyes were running and she lost her voice for 3 days.  She tested Covid negative.  Following this she developed pressure in her head that she relates to as "sinus pressure".  She was seen in urgent care and was started on amoxicillin twice daily for 10 days.  She also has some fullness or pressure in her ears.  Her voice gradually got better in the "sinus pressure" also improved.  However she has had a little bit of pressure in her ears and she still had a intermittent cough that has not completely cleared. She never coughed up purulent mucus or blew out purulent mucus from her nose. She is doing much better now.  No past medical history on file. Past Surgical History:  Procedure Laterality Date  . APPENDECTOMY  2005   with exp lap  . COLON SURGERY  2005   exp lap-colectomy benign mass  . COLONOSCOPY  04/19/12   normal/ re check 5 years (Dr Yvetta Coder had 3  . OPEN REDUCTION INTERNAL FIXATION (ORIF) METACARPAL Left 01/31/2013   Procedure: OPEN REDUCTION INTERNAL FIXATION (ORIF) 5TH METACARPAL LEFT HAND;  Surgeon: Wynonia Sours, MD;  Location: [place];  Service: Orthopedics;  Laterality: Left;   Social History   Socioeconomic History  . Marital status: Married    Spouse name: Not on file  . Number of children: Not on file  . Years of education: Not on file  . Highest education level: Not on file  Occupational History  . Not on file  Tobacco Use  . Smoking status: Never Smoker  . Smokeless tobacco: Never Used  Substance and Sexual Activity  . Alcohol use: Yes    Comment: social  . Drug use: No  . Sexual activity: Never  Other Topics Concern  . Not on file  Social History Narrative  . Not on  file   Social Determinants of Health   Financial Resource Strain:   . Difficulty of Paying Living Expenses:   Food Insecurity:   . Worried About Charity fundraiser in the Last Year:   . Arboriculturist in the Last Year:   Transportation Needs:   . Film/video editor (Medical):   Marland Kitchen Lack of Transportation (Non-Medical):   Physical Activity:   . Days of Exercise per Week:   . Minutes of Exercise per Session:   Stress:   . Feeling of Stress :   Social Connections:   . Frequency of Communication with Friends and Family:   . Frequency of Social Gatherings with Friends and Family:   . Attends Religious Services:   . Active Member of Clubs or Organizations:   . Attends Archivist Meetings:   Marland Kitchen Marital Status:    No family history on file. No Known Allergies Prior to Admission medications   Medication Sig Start Date End Date Taking? Authorizing Provider  triamcinolone cream (KENALOG) 0.1 % Apply 1 application topically 2 (two) times daily. Patient not taking: Reported on 05/08/2020 12/01/16   Ivar Drape D, PA     Positive ROS: Otherwise negative.  She is hearing well.  All other systems have been reviewed and were otherwise negative  with the exception of those mentioned in the HPI and as above.  Physical Exam: Constitutional: Alert, well-appearing, no acute distress Ears: External ears without lesions or tenderness. Ear canals are clear bilaterally.  TMs are clear. Nasal: External nose without lesions. Septum mildly deviated to the right.. Clear nasal passages.  Both middle meatus regions were clear.  Nasopharynx is clear. Oral: Lips and gums without lesions. Tongue and palate mucosa without lesions. Posterior oropharynx clear. Fiberoptic laryngoscopy was performed through the left nostril and on fiberoptic laryngoscopy the nasopharynx was clear eustachian tube areas were clear.  Base of tongue vallecula and epiglottis were normal.  Vocal cords were clear  bilaterally with normal vocal mobility. Neck: No palpable adenopathy or masses Respiratory: Breathing comfortably  Skin: No facial/neck lesions or rash noted.  Procedures  Assessment: Probable viral bronchitis and laryngitis. Presently no clinical evidence of active infection.  Plan: Suggested use of saline irrigation for any drainage or burning in her nose.  Also prescribed Nasacort 2 sprays each nostril at night as she can use if she develops any further symptoms of sinus pressure ear pressure or congestion in the nose or sinuses.  Radene Journey, MD

## 2020-05-14 ENCOUNTER — Other Ambulatory Visit: Payer: Self-pay

## 2020-05-14 ENCOUNTER — Emergency Department (HOSPITAL_COMMUNITY)
Admission: EM | Admit: 2020-05-14 | Discharge: 2020-05-14 | Disposition: A | Discharge status: Home / Self Care | Payer: BC Managed Care – PPO | Attending: Emergency Medicine | Admitting: Emergency Medicine

## 2020-05-14 ENCOUNTER — Encounter (HOSPITAL_COMMUNITY): Payer: Self-pay | Admitting: Emergency Medicine

## 2020-05-14 DIAGNOSIS — R21 Rash and other nonspecific skin eruption: Secondary | ICD-10-CM | POA: Diagnosis not present

## 2020-05-14 DIAGNOSIS — Z5321 Procedure and treatment not carried out due to patient leaving prior to being seen by health care provider: Secondary | ICD-10-CM | POA: Diagnosis not present

## 2020-05-14 DIAGNOSIS — L299 Pruritus, unspecified: Secondary | ICD-10-CM | POA: Insufficient documentation

## 2020-05-14 MED ORDER — DIPHENHYDRAMINE HCL 25 MG PO CAPS
25.0000 mg | ORAL_CAPSULE | Freq: Once | ORAL | Status: AC
  Administered 2020-05-14: 04:00:00 25 mg via ORAL
  Filled 2020-05-14: qty 1

## 2020-05-14 NOTE — ED Notes (Signed)
Pt feel that her allergic reaction is under control and is planning on going home and make an appointment in the morning with her primary care doctor.

## 2020-05-14 NOTE — ED Triage Notes (Addendum)
Pt presents to ED POV. Pt c/o itching and rash all over after using voltaren cream. Pt reports that her mouth began to feeling like it's swelling but no longer does. Airway intact, no facial swelling noted.

## 2021-07-01 ENCOUNTER — Telehealth: Payer: Self-pay | Admitting: Cardiology

## 2021-09-29 DIAGNOSIS — M9905 Segmental and somatic dysfunction of pelvic region: Secondary | ICD-10-CM | POA: Diagnosis not present

## 2021-09-29 DIAGNOSIS — M9904 Segmental and somatic dysfunction of sacral region: Secondary | ICD-10-CM | POA: Diagnosis not present

## 2021-09-29 DIAGNOSIS — M9901 Segmental and somatic dysfunction of cervical region: Secondary | ICD-10-CM | POA: Diagnosis not present

## 2021-09-29 DIAGNOSIS — M9903 Segmental and somatic dysfunction of lumbar region: Secondary | ICD-10-CM | POA: Diagnosis not present

## 2021-10-05 DIAGNOSIS — Z681 Body mass index (BMI) 19 or less, adult: Secondary | ICD-10-CM | POA: Diagnosis not present

## 2021-10-05 DIAGNOSIS — Z01419 Encounter for gynecological examination (general) (routine) without abnormal findings: Secondary | ICD-10-CM | POA: Diagnosis not present

## 2021-10-05 DIAGNOSIS — Z1231 Encounter for screening mammogram for malignant neoplasm of breast: Secondary | ICD-10-CM | POA: Diagnosis not present

## 2021-10-08 DIAGNOSIS — M9901 Segmental and somatic dysfunction of cervical region: Secondary | ICD-10-CM | POA: Diagnosis not present

## 2021-10-08 DIAGNOSIS — M9905 Segmental and somatic dysfunction of pelvic region: Secondary | ICD-10-CM | POA: Diagnosis not present

## 2021-10-08 DIAGNOSIS — M9904 Segmental and somatic dysfunction of sacral region: Secondary | ICD-10-CM | POA: Diagnosis not present

## 2021-10-08 DIAGNOSIS — M9903 Segmental and somatic dysfunction of lumbar region: Secondary | ICD-10-CM | POA: Diagnosis not present

## 2021-10-27 DIAGNOSIS — M9904 Segmental and somatic dysfunction of sacral region: Secondary | ICD-10-CM | POA: Diagnosis not present

## 2021-10-27 DIAGNOSIS — M9905 Segmental and somatic dysfunction of pelvic region: Secondary | ICD-10-CM | POA: Diagnosis not present

## 2021-10-27 DIAGNOSIS — M9903 Segmental and somatic dysfunction of lumbar region: Secondary | ICD-10-CM | POA: Diagnosis not present

## 2021-10-27 DIAGNOSIS — M9901 Segmental and somatic dysfunction of cervical region: Secondary | ICD-10-CM | POA: Diagnosis not present

## 2021-11-10 DIAGNOSIS — M9904 Segmental and somatic dysfunction of sacral region: Secondary | ICD-10-CM | POA: Diagnosis not present

## 2021-11-10 DIAGNOSIS — M9901 Segmental and somatic dysfunction of cervical region: Secondary | ICD-10-CM | POA: Diagnosis not present

## 2021-11-10 DIAGNOSIS — M9905 Segmental and somatic dysfunction of pelvic region: Secondary | ICD-10-CM | POA: Diagnosis not present

## 2021-11-10 DIAGNOSIS — M9903 Segmental and somatic dysfunction of lumbar region: Secondary | ICD-10-CM | POA: Diagnosis not present

## 2021-11-24 DIAGNOSIS — M9901 Segmental and somatic dysfunction of cervical region: Secondary | ICD-10-CM | POA: Diagnosis not present

## 2021-11-24 DIAGNOSIS — M9904 Segmental and somatic dysfunction of sacral region: Secondary | ICD-10-CM | POA: Diagnosis not present

## 2021-11-24 DIAGNOSIS — M9903 Segmental and somatic dysfunction of lumbar region: Secondary | ICD-10-CM | POA: Diagnosis not present

## 2021-11-24 DIAGNOSIS — M9905 Segmental and somatic dysfunction of pelvic region: Secondary | ICD-10-CM | POA: Diagnosis not present

## 2021-12-15 DIAGNOSIS — M9901 Segmental and somatic dysfunction of cervical region: Secondary | ICD-10-CM | POA: Diagnosis not present

## 2021-12-15 DIAGNOSIS — M9903 Segmental and somatic dysfunction of lumbar region: Secondary | ICD-10-CM | POA: Diagnosis not present

## 2021-12-15 DIAGNOSIS — M9905 Segmental and somatic dysfunction of pelvic region: Secondary | ICD-10-CM | POA: Diagnosis not present

## 2021-12-15 DIAGNOSIS — M9904 Segmental and somatic dysfunction of sacral region: Secondary | ICD-10-CM | POA: Diagnosis not present

## 2021-12-31 DIAGNOSIS — M9901 Segmental and somatic dysfunction of cervical region: Secondary | ICD-10-CM | POA: Diagnosis not present

## 2021-12-31 DIAGNOSIS — M9905 Segmental and somatic dysfunction of pelvic region: Secondary | ICD-10-CM | POA: Diagnosis not present

## 2021-12-31 DIAGNOSIS — M9904 Segmental and somatic dysfunction of sacral region: Secondary | ICD-10-CM | POA: Diagnosis not present

## 2021-12-31 DIAGNOSIS — M9903 Segmental and somatic dysfunction of lumbar region: Secondary | ICD-10-CM | POA: Diagnosis not present

## 2022-05-06 DIAGNOSIS — F4323 Adjustment disorder with mixed anxiety and depressed mood: Secondary | ICD-10-CM | POA: Diagnosis not present

## 2022-08-12 DIAGNOSIS — M9901 Segmental and somatic dysfunction of cervical region: Secondary | ICD-10-CM | POA: Diagnosis not present

## 2022-08-12 DIAGNOSIS — M9904 Segmental and somatic dysfunction of sacral region: Secondary | ICD-10-CM | POA: Diagnosis not present

## 2022-08-12 DIAGNOSIS — M9905 Segmental and somatic dysfunction of pelvic region: Secondary | ICD-10-CM | POA: Diagnosis not present

## 2022-08-12 DIAGNOSIS — M9903 Segmental and somatic dysfunction of lumbar region: Secondary | ICD-10-CM | POA: Diagnosis not present

## 2022-10-07 DIAGNOSIS — Z08 Encounter for follow-up examination after completed treatment for malignant neoplasm: Secondary | ICD-10-CM | POA: Diagnosis not present

## 2022-10-07 DIAGNOSIS — Z86010 Personal history of colonic polyps: Secondary | ICD-10-CM | POA: Diagnosis not present

## 2022-10-07 DIAGNOSIS — D123 Benign neoplasm of transverse colon: Secondary | ICD-10-CM | POA: Diagnosis not present

## 2022-10-07 DIAGNOSIS — Z85038 Personal history of other malignant neoplasm of large intestine: Secondary | ICD-10-CM | POA: Diagnosis not present

## 2022-10-07 DIAGNOSIS — K648 Other hemorrhoids: Secondary | ICD-10-CM | POA: Diagnosis not present

## 2022-10-07 DIAGNOSIS — Z980 Intestinal bypass and anastomosis status: Secondary | ICD-10-CM | POA: Diagnosis not present

## 2022-10-07 DIAGNOSIS — D128 Benign neoplasm of rectum: Secondary | ICD-10-CM | POA: Diagnosis not present

## 2022-10-28 DIAGNOSIS — Z01419 Encounter for gynecological examination (general) (routine) without abnormal findings: Secondary | ICD-10-CM | POA: Diagnosis not present

## 2022-10-28 DIAGNOSIS — Z1231 Encounter for screening mammogram for malignant neoplasm of breast: Secondary | ICD-10-CM | POA: Diagnosis not present

## 2022-10-28 DIAGNOSIS — Z681 Body mass index (BMI) 19 or less, adult: Secondary | ICD-10-CM | POA: Diagnosis not present

## 2022-12-06 DIAGNOSIS — F4323 Adjustment disorder with mixed anxiety and depressed mood: Secondary | ICD-10-CM | POA: Diagnosis not present

## 2022-12-31 ENCOUNTER — Emergency Department (HOSPITAL_BASED_OUTPATIENT_CLINIC_OR_DEPARTMENT_OTHER): Payer: BC Managed Care – PPO

## 2022-12-31 ENCOUNTER — Emergency Department (HOSPITAL_BASED_OUTPATIENT_CLINIC_OR_DEPARTMENT_OTHER)
Admission: EM | Admit: 2022-12-31 | Discharge: 2022-12-31 | Disposition: A | Discharge status: Home / Self Care | Payer: BC Managed Care – PPO | Attending: Emergency Medicine | Admitting: Emergency Medicine

## 2022-12-31 ENCOUNTER — Other Ambulatory Visit: Payer: Self-pay

## 2022-12-31 ENCOUNTER — Encounter (HOSPITAL_BASED_OUTPATIENT_CLINIC_OR_DEPARTMENT_OTHER): Payer: Self-pay | Admitting: Emergency Medicine

## 2022-12-31 DIAGNOSIS — S060X0A Concussion without loss of consciousness, initial encounter: Secondary | ICD-10-CM | POA: Diagnosis not present

## 2022-12-31 DIAGNOSIS — S0990XA Unspecified injury of head, initial encounter: Secondary | ICD-10-CM | POA: Diagnosis not present

## 2022-12-31 DIAGNOSIS — W228XXA Striking against or struck by other objects, initial encounter: Secondary | ICD-10-CM | POA: Insufficient documentation

## 2022-12-31 NOTE — ED Triage Notes (Signed)
Pt was head butted by horse on Tuesday. She felt dizzy and "funny after". She was wearing hard helmet. She went home to rest. She is not on blood thinners. Every day she felt little better but last night she drove and she felt like motion of lights and signs made her feel dizzy and she felt like she wanted to look down and close her eyes. Denies nausea. Today she presents because she feels tired and has headache.

## 2022-12-31 NOTE — ED Provider Notes (Signed)
[facility] Provider Note   CSN: [id number] Arrival date & time: 12/31/22  0944     History  Chief Complaint  Patient presents with   Head Injury    Bethany Herman is a 57 y.o. female.  The history is provided by the patient, the spouse and medical records. No language interpreter was used.  Head Injury    57 year old female presenting for evaluation of head injury.  3 days ago patient was head butted by her horse while she was wearing a hard helmet.  She was riding on the horse when the horse move his head backward and his neck struck her helmet.  She did not fall off the horse.  She was able to dismount afterward.  Report she felt dizzy and "funny" afterward.  She went home to rest, the next day she felt a bit better and states for the subsequent days she was doing fine however since last night and today she endorsed increased throbbing frontal headache, not feeling well, having trouble thinking, having light and sound sensitivity which concerns her.  She denies any vision changes no neck pain no focal numbness or focal weakness. She reports headache is a bit mild.  She does not endorse nausea or vomiting.  No runny nose sneezing coughing no fever chills  Home Medications Prior to Admission medications   Medication Sig Start Date End Date Taking? Authorizing Provider  triamcinolone cream (KENALOG) 0.1 % Apply 1 application topically 2 (two) times daily. Patient not taking: Reported on 05/08/2020 12/01/16   Ivar Drape D, PA      Allergies    Patient has no known allergies.    Review of Systems   Review of Systems  All other systems reviewed and are negative.   Physical Exam Updated Vital Signs BP (!) 144/94 (BP Location: Right Arm)   Pulse 64   Temp 98.6 F (37 C)   Resp 14   SpO2 100%  Physical Exam Vitals and nursing note reviewed.  Constitutional:      General: She is not in acute distress.    Appearance: She is  well-developed.  HENT:     Head: Normocephalic and atraumatic.  Eyes:     Conjunctiva/sclera: Conjunctivae normal.  Pulmonary:     Effort: Pulmonary effort is normal.  Musculoskeletal:     Cervical back: Normal range of motion and neck supple. No tenderness.  Skin:    Findings: No rash.  Neurological:     Mental Status: She is alert and oriented to person, place, and time.     GCS: GCS eye subscore is 4. GCS verbal subscore is 5. GCS motor subscore is 6.     Cranial Nerves: Cranial nerves 2-12 are intact.     Sensory: Sensation is intact.     Motor: Motor function is intact.     Coordination: Coordination is intact.     Comments: Able to perform 3 words recall after 4 minutes.  Psychiatric:        Mood and Affect: Mood normal.     ED Results / Procedures / Treatments   Labs (all labs ordered are listed, but only abnormal results are displayed) Labs Reviewed - No data to display  EKG None  Radiology CT Head Wo Contrast  Result Date: 12/31/2022 CLINICAL DATA:  Head trauma. EXAM: CT HEAD WITHOUT CONTRAST TECHNIQUE: Contiguous axial images were obtained from the base of the skull through the vertex without intravenous contrast. RADIATION  DOSE REDUCTION: This exam was performed according to the departmental dose-optimization program which includes automated exposure control, adjustment of the mA and/or kV according to patient size and/or use of iterative reconstruction technique. COMPARISON:  None Available. FINDINGS: Brain: No acute hemorrhage, mass effect or midline shift. Gray-white differentiation is preserved. No hydrocephalus. No extra-axial collection. Basilar cisterns are patent. Vascular: No hyperdense vessel or unexpected calcification. Skull: No calvarial fracture or suspicious bone lesion. Skull base is unremarkable. Sinuses/Orbits: Unremarkable. Other: None. IMPRESSION: No evidence of acute intracranial injury. Electronically Signed   By: Emmit Alexanders M.D.   On:  12/31/2022 10:59    Procedures Procedures    Medications Ordered in ED Medications - No data to display  ED Course/ Medical Decision Making/ A&P                             Medical Decision Making Amount and/or Complexity of Data Reviewed Radiology: ordered.   BP (!) 144/94 (BP Location: Right Arm)   Pulse 64   Temp 98.6 F (37 C)   Resp 14   SpO2 100%   31:47 AM  57 year old female presenting for evaluation of head injury.  3 days ago patient was head butted by her horse while she was wearing a hard helmet.  She was riding on the horse when the horse move his head backward and his neck struck her helmet.  She did not fall off the horse.  She was able to dismount afterward.  Report she felt dizzy and "funny" afterward.  She went home to rest, the next day she felt a bit better and states for the subsequent days she was doing fine however since last night and today she endorsed increased throbbing frontal headache, not feeling well, having trouble thinking, having light and sound sensitivity which concerns her.  She denies any vision changes no neck pain no focal numbness or focal weakness. She reports headache is a bit mild.  She does not endorse nausea or vomiting.  No runny nose sneezing coughing no fever chills  On exam this is a well-appearing female appears to be in no acute discomfort.  She has no focal neurodeficit on exam.  She does not have any reproducible forehead or scalp tenderness.  No midline cervical spine tenderness.  She has equal strength throughout.  She has a normal gait.  She was able to perform 3 words recall without difficulty after 4 minutes.  Vital sign reviewed and overall reassuring.  Symptoms suggestive of a concussion however due to worsening headache, will obtain head CT scan to rule out slow head bleed.  CT scan result was reviewed interpreted by me and agree with radiology interpretation.  Fortunately CT scan did not show any concerning finding.  I  discussed patient about concussion protocol and avoidance of activities that can worsen her symptoms.  Otherwise patient is stable to be discharged home with supportive care including Tylenol or ibuprofen as needed for headache.        Final Clinical Impression(s) / ED Diagnoses Final diagnoses:  Minor head injury without loss of consciousness, initial encounter  Concussion without loss of consciousness, initial encounter    Rx / DC Orders ED Discharge Orders     None         Domenic Moras, PA-C 12/31/22 1146    Wyvonnia Dusky, MD 12/31/22 1301

## 2023-01-03 ENCOUNTER — Telehealth: Payer: Self-pay

## 2023-01-03 NOTE — Telephone Encounter (Deleted)
Transition Care Management Follow-up Telephone Call Date of discharge and from where: *** How have you been since you were released from the hospital? *** Any questions or concerns? {YES/NO TOC TRACKING USE FOR MANAGED MEDICAID REPORTING:24185}  Items Reviewed: Did the pt receive and understand the discharge instructions provided? {YES/NO:21197} Medications obtained and verified? {YES/NO:21197} Other? {YES/NO:21197} Any new allergies since your discharge? {YES/NO:21197} Dietary orders reviewed? {YES/NO TITLE CASE:22902} Do you have support at home? {YES/NO:21197}  Home Care and Equipment/Supplies: Were home health services ordered? {Response; yes/no/na:63} If so, what is the name of the agency? ***  Has the agency set up a time to come to the patient's home? {Response; yes/no/na:63} Were any new equipment or medical supplies ordered?  {Yes/No:[id number]} What is the name of the medical supply agency? *** Were you able to get the supplies/equipment? {Response; yes/no/na:63} Do you have any questions related to the use of the equipment or supplies? {Yes/No:[id number]}  Functional Questionnaire: (I = Independent and D = Dependent) ADLs: ***  Bathing/Dressing- ***  Meal Prep- ***  Eating- ***  Maintaining continence- ***  Transferring/Ambulation- ***  Managing Meds- ***  Follow up appointments reviewed:  PCP Hospital f/u appt confirmed? {YES/NO:21197} Scheduled to see *** on *** @ ***. Specialist Hospital f/u appt confirmed? {YES/NO:21197} Scheduled to see *** on *** @ ***. Are transportation arrangements needed? {YES/NO:21197} If their condition worsens, is the pt aware to call PCP or go to the Emergency Dept.? {YES/NO TITLE CASE:22902} Was the patient provided with contact information for the PCP's office or ED? {YES/NO TITLE CASE:22902} Was to pt encouraged to call back with questions or concerns? {YES/NO TITLE CASE:22902}  

## 2023-01-03 NOTE — Transitions of Care (Post Inpatient/ED Visit) (Signed)
   01/03/2023  Name: Bethany Herman MRN: [id number] DOB: 16-Jun-1966  Today's TOC FU Call Status: Today's TOC FU Call Status:: Successful TOC FU Call Competed TOC FU Call Complete Date: 01/03/23  Transition Care Management Follow-up Telephone Call Date of Discharge: 12/31/22 Discharge Facility: [facility] ([facility]) Type of Discharge: Emergency Department Reason for ED Visit: Other: (Head Injury) How have you been since you were released from the hospital?: Better Any questions or concerns?: Yes Patient Questions/Concerns:: unsure if she is still a current pt at office Patient Questions/Concerns Addressed: Other:  Items Reviewed: Did you receive and understand the discharge instructions provided?: Yes Any new allergies since your discharge?: No Dietary orders reviewed?: NA Do you have support at home?: Yes People in Home: spouse  Home Care and Equipment/Supplies: [facility] Ordered?: NA Any new equipment or medical supplies ordered?: NA  Functional Questionnaire: Do you need assistance with bathing/showering or dressing?: No Do you need assistance with meal preparation?: No Do you need assistance with eating?: No Do you have difficulty maintaining continence: No Do you need assistance with getting out of bed/getting out of a chair/moving?: No Do you have difficulty managing or taking your medications?: No  Folllow up appointments reviewed: PCP Follow-up appointment confirmed?: NA Specialist Hospital Follow-up appointment confirmed?: NA Do you understand care options if your condition(s) worsen?: Yes-patient verbalized understanding    [place], LPN [place] [facility] [employer] # [phone]

## 2023-01-12 DIAGNOSIS — F4323 Adjustment disorder with mixed anxiety and depressed mood: Secondary | ICD-10-CM | POA: Diagnosis not present

## 2023-04-05 ENCOUNTER — Ambulatory Visit: Payer: BC Managed Care – PPO | Admitting: Family

## 2023-04-05 ENCOUNTER — Encounter: Payer: Self-pay | Admitting: Family

## 2023-04-05 VITALS — BP 120/64 | HR 74 | Temp 97.6°F | Ht 69.0 in | Wt 121.8 lb

## 2023-04-05 DIAGNOSIS — L853 Xerosis cutis: Secondary | ICD-10-CM | POA: Diagnosis not present

## 2023-04-05 DIAGNOSIS — R4189 Other symptoms and signs involving cognitive functions and awareness: Secondary | ICD-10-CM | POA: Diagnosis not present

## 2023-04-05 DIAGNOSIS — Z0000 Encounter for general adult medical examination without abnormal findings: Secondary | ICD-10-CM | POA: Insufficient documentation

## 2023-04-05 DIAGNOSIS — L659 Nonscarring hair loss, unspecified: Secondary | ICD-10-CM

## 2023-04-05 DIAGNOSIS — Z636 Dependent relative needing care at home: Secondary | ICD-10-CM

## 2023-04-05 DIAGNOSIS — Z0001 Encounter for general adult medical examination with abnormal findings: Secondary | ICD-10-CM

## 2023-04-05 DIAGNOSIS — R636 Underweight: Secondary | ICD-10-CM | POA: Diagnosis not present

## 2023-04-05 DIAGNOSIS — Z800 Family history of malignant neoplasm of digestive organs: Secondary | ICD-10-CM | POA: Insufficient documentation

## 2023-04-05 DIAGNOSIS — N941 Unspecified dyspareunia: Secondary | ICD-10-CM | POA: Insufficient documentation

## 2023-04-05 DIAGNOSIS — Z1283 Encounter for screening for malignant neoplasm of skin: Secondary | ICD-10-CM

## 2023-04-05 DIAGNOSIS — L237 Allergic contact dermatitis due to plants, except food: Secondary | ICD-10-CM

## 2023-04-05 DIAGNOSIS — Z13220 Encounter for screening for lipoid disorders: Secondary | ICD-10-CM

## 2023-04-05 LAB — BASIC METABOLIC PANEL
BUN: 14 mg/dL (ref 6–23)
CO2: 29 mEq/L (ref 19–32)
Calcium: 9.6 mg/dL (ref 8.4–10.5)
Chloride: 103 mEq/L (ref 96–112)
Creatinine, Ser: 0.83 mg/dL (ref 0.40–1.20)
GFR: 77.35 mL/min (ref >60.00)
Glucose, Bld: 71 mg/dL (ref 70–99)
Potassium: 4.1 mEq/L (ref 3.5–5.1)
Sodium: 140 mEq/L (ref 135–145)

## 2023-04-05 LAB — LIPID PANEL
Cholesterol: 211 mg/dL — ABNORMAL HIGH (ref 0–200)
HDL: 94.2 mg/dL (ref >39.00)
LDL Cholesterol: 106 mg/dL — ABNORMAL HIGH (ref 0–99)
NonHDL: 116.95
Total CHOL/HDL Ratio: 2
Triglycerides: 57 mg/dL (ref 0.0–149.0)
VLDL: 11.4 mg/dL (ref 0.0–40.0)

## 2023-04-05 LAB — CBC
HCT: 42.3 % (ref 36.0–46.0)
Hemoglobin: 13.8 g/dL (ref 12.0–15.0)
MCHC: 32.7 g/dL (ref 30.0–36.0)
MCV: 95.9 fl (ref 78.0–100.0)
Platelets: 226 10*3/uL (ref 150.0–400.0)
RBC: 4.41 Mil/uL (ref 3.87–5.11)
RDW: 13 % (ref 11.5–15.5)
WBC: 4.2 10*3/uL (ref 4.0–10.5)

## 2023-04-05 LAB — VITAMIN B12: Vitamin B-12: 249 pg/mL (ref 211–911)

## 2023-04-05 MED ORDER — PREDNISONE 20 MG PO TABS: ORAL_TABLET | ORAL | 0 refills | Status: DC

## 2023-04-05 MED ORDER — TRIAMCINOLONE ACETONIDE 0.1 % EX CREA: 1.0000 | TOPICAL_CREAM | Freq: Two times a day (BID) | CUTANEOUS | 0 refills | Status: AC

## 2023-04-05 NOTE — Assessment & Plan Note (Signed)
Discussed with her trying to seek consult with her old therapist and consider therapy again, she is agreeable to this.  Also advised her if she wanting to consider ssri in the future to let me know, declines for now.

## 2023-04-05 NOTE — Assessment & Plan Note (Signed)
Will do thyroid work up as well as assess b12 pending results.  Also discussed may be result of anxiety/stress.

## 2023-04-05 NOTE — Assessment & Plan Note (Signed)
Patient Counseling(The following topics were reviewed):  Preventative care handout given to pt  Health maintenance and immunizations reviewed. Please refer to Health maintenance section. Pt advised on safe sex, wearing seatbelts in car, and proper nutrition labwork ordered today for annual Dental health: Discussed importance of regular tooth brushing, flossing, and dental visits.  Pt aware to f/u with ophthalmologist for annual eye exam.

## 2023-04-05 NOTE — Progress Notes (Signed)
New Patient Office Visit  Subjective:  Patient ID: Bethany Herman, female    DOB: Mar 19, 1966  Age: 57 y.o. MRN: [id number]  CC:  Chief Complaint  Patient presents with   Annual Exam    Establish care    HPI Bethany Herman is here to establish care as a new patient as well as here for annual exam.  Oriented to practice routines and expectations.  Prior provider was: Dr. Roxy Manns   Pap: 08/23/2019  Colonoscopy: 10/07/2022 every five years.  Pmp  Shingles vaccine, will schedule declines today.  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 She has had laser therapy in the past.  She had some relief for some time, and they had went away but states they are visible again. She denies throbbing pain, denies redness and or warmth to site, and not bulging. Does not cause walking pain.   Did have concussion back in March, she was checked in the ER on 3/8. She was head butted in the head while riding a horse. She felt dizzy afterward and they did a Ct head w/o and there was no acute injury.   She does report some declining vision and she went to eye doctor who suggested change in RX. She didn't increase to their dosage recommended and she stays at 200 but still reports some vision change. She is overdue with eye exams, it has been about two years. She will make appt to go back.   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 She has had therapy in the past especially going through some issues with chronic sickness with her husband.        ROS: Negative unless specifically indicated above in HPI.   Current Outpatient Medications:    predniSONE (DELTASONE) 20 MG tablet, Take two tablets po qd for five days, one tablet po qd for five days, then 1/2  tablet po qd for five days, Disp: 18 tablet, Rfl: 0   triamcinolone cream (KENALOG) 0.1 %, Apply 1 Application topically 2 (two) times daily., Disp: 30 g, Rfl: 0 History reviewed. No pertinent past medical history. Past Surgical History:  Procedure Laterality Date   APPENDECTOMY  10/26/2003   with exp lap   COLON SURGERY  10/26/2003   exp lap-colectomy benign mass   COLONOSCOPY  04/19/2012   normal/ re check 5 years (Dr Eli Hose had 3   OPEN REDUCTION INTERNAL FIXATION (ORIF) METACARPAL Left 01/31/2013   Procedure: OPEN REDUCTION INTERNAL FIXATION (ORIF) 5TH METACARPAL LEFT HAND;  Surgeon: Nicki Reaper, MD;  Location: [facility];  Service: Orthopedics;  Laterality: Left;   SHOULDER SURGERY Right     Objective:   Today's Vitals: BP 120/64 (BP Location: Left Arm)   Pulse 74   Temp 97.6 F (36.4 C) (Temporal)   Ht 5\' 9"  (1.753 m)   Wt 121 lb 12.8 oz (55.2 kg)   SpO2 98%   BMI 17.99 kg/m   Physical Exam Constitutional:      General: She is not in acute distress.    Appearance: Normal appearance. She is normal weight. She is not ill-appearing.  HENT:     Head: Normocephalic.  Right Ear: Tympanic membrane normal.     Left Ear: Tympanic membrane normal.     Nose: Nose normal.     Mouth/Throat:     Mouth: Mucous membranes are moist.  Eyes:     Extraocular Movements: Extraocular movements intact.     Pupils: Pupils are equal, round, and reactive to light.  Cardiovascular:     Rate and Rhythm: Normal rate and regular rhythm.  Pulmonary:     Effort: Pulmonary effort is normal.     Breath sounds: Normal breath sounds.  Abdominal:     General: Abdomen is flat. Bowel sounds are normal.     Palpations: Abdomen is soft.     Tenderness: There is no guarding or rebound.  Musculoskeletal:        General: Normal range of motion.     Cervical back: Normal range of motion.  Skin:    General: Skin is warm.     Capillary Refill: Capillary refill takes less than 2  seconds.     Comments: Linear streaking of erythema with localized vesicular lesion on anterior lower forearm left arm  Neurological:     General: No focal deficit present.     Mental Status: She is alert.  Psychiatric:        Mood and Affect: Mood normal.        Behavior: Behavior normal.        Thought Content: Thought content normal.        Judgment: Judgment normal.     Assessment & Plan:  Screening for skin cancer -     Ambulatory referral to Dermatology  Brain fog Assessment & Plan: Will do thyroid work up as well as assess b12 pending results.  Also discussed may be result of anxiety/stress.  Orders: -     Vitamin B12  Underweight  Dry skin  Poison oak dermatitis Assessment & Plan: Rx prednisone as well as triamincinolone topical cream    Orders: -     Triamcinolone Acetonide; Apply 1 Application topically 2 (two) times daily.  Dispense: 30 g; Refill: 0 -     predniSONE; Take two tablets po qd for five days, one tablet po qd for five days, then 1/2 tablet po qd for five days  Dispense: 18 tablet; Refill: 0  Thinning hair  Encounter for general adult medical examination without abnormal findings Assessment & Plan: Patient Counseling(The following topics were reviewed):  Preventative care handout given to pt  Health maintenance and immunizations reviewed. Please refer to Health maintenance section. Pt advised on safe sex, wearing seatbelts in car, and proper nutrition labwork ordered today for annual Dental health: Discussed importance of regular tooth brushing, flossing, and dental visits.  Pt aware to f/u with ophthalmologist for annual eye exam.  Orders: -     CBC -     Lipid panel -     Basic metabolic panel  Screening for lipoid disorders  Caregiver burden Assessment & Plan: Discussed with her trying to seek consult with her old therapist and consider therapy again, she is agreeable to this.  Also advised her if she wanting to consider ssri in  the future to let me know, declines for now.      Follow-up: Return in about 1 year (around 04/04/2024) for f/u CPE.   Mort Sawyers, FNP

## 2023-04-05 NOTE — Patient Instructions (Addendum)
A referral was placed today for dermatology.  Please let us know if you have not heard back within 2 weeks about the referral.   Regards,   Graham Hyun FNP-C   

## 2023-04-05 NOTE — Assessment & Plan Note (Signed)
Rx prednisone as well as triamincinolone topical cream

## 2023-04-07 ENCOUNTER — Encounter: Payer: Self-pay | Admitting: *Deleted

## 2023-04-21 DIAGNOSIS — L814 Other melanin hyperpigmentation: Secondary | ICD-10-CM | POA: Diagnosis not present

## 2023-04-21 DIAGNOSIS — L821 Other seborrheic keratosis: Secondary | ICD-10-CM | POA: Diagnosis not present

## 2023-04-21 DIAGNOSIS — D225 Melanocytic nevi of trunk: Secondary | ICD-10-CM | POA: Diagnosis not present

## 2023-04-21 DIAGNOSIS — I788 Other diseases of capillaries: Secondary | ICD-10-CM | POA: Diagnosis not present

## 2023-12-14 DIAGNOSIS — Z1231 Encounter for screening mammogram for malignant neoplasm of breast: Secondary | ICD-10-CM | POA: Diagnosis not present

## 2023-12-14 DIAGNOSIS — Z01419 Encounter for gynecological examination (general) (routine) without abnormal findings: Secondary | ICD-10-CM | POA: Diagnosis not present

## 2023-12-14 DIAGNOSIS — Z124 Encounter for screening for malignant neoplasm of cervix: Secondary | ICD-10-CM | POA: Diagnosis not present

## 2023-12-27 DIAGNOSIS — D518 Other vitamin B12 deficiency anemias: Secondary | ICD-10-CM | POA: Diagnosis not present

## 2023-12-27 DIAGNOSIS — E7849 Other hyperlipidemia: Secondary | ICD-10-CM | POA: Diagnosis not present

## 2023-12-27 DIAGNOSIS — E039 Hypothyroidism, unspecified: Secondary | ICD-10-CM | POA: Diagnosis not present

## 2023-12-27 DIAGNOSIS — N951 Menopausal and female climacteric states: Secondary | ICD-10-CM | POA: Diagnosis not present

## 2024-01-13 DIAGNOSIS — Z131 Encounter for screening for diabetes mellitus: Secondary | ICD-10-CM | POA: Diagnosis not present

## 2024-01-13 DIAGNOSIS — E039 Hypothyroidism, unspecified: Secondary | ICD-10-CM | POA: Diagnosis not present

## 2024-01-13 DIAGNOSIS — E559 Vitamin D deficiency, unspecified: Secondary | ICD-10-CM | POA: Diagnosis not present

## 2024-01-13 DIAGNOSIS — N951 Menopausal and female climacteric states: Secondary | ICD-10-CM | POA: Diagnosis not present

## 2024-01-13 DIAGNOSIS — E7849 Other hyperlipidemia: Secondary | ICD-10-CM | POA: Diagnosis not present

## 2024-01-13 DIAGNOSIS — D518 Other vitamin B12 deficiency anemias: Secondary | ICD-10-CM | POA: Diagnosis not present

## 2024-01-13 DIAGNOSIS — G3184 Mild cognitive impairment, so stated: Secondary | ICD-10-CM | POA: Diagnosis not present

## 2024-01-13 DIAGNOSIS — E617 Deficiency of multiple nutrient elements: Secondary | ICD-10-CM | POA: Diagnosis not present

## 2024-02-06 DIAGNOSIS — R011 Cardiac murmur, unspecified: Secondary | ICD-10-CM | POA: Diagnosis not present

## 2024-06-12 ENCOUNTER — Ambulatory Visit: Payer: Self-pay

## 2024-06-12 ENCOUNTER — Encounter: Payer: Self-pay | Admitting: Family Medicine

## 2024-06-12 ENCOUNTER — Ambulatory Visit: Admitting: Family Medicine

## 2024-06-12 VITALS — BP 120/82 | HR 64 | Temp 98.1°F | Ht 69.0 in | Wt 120.2 lb

## 2024-06-12 DIAGNOSIS — I340 Nonrheumatic mitral (valve) insufficiency: Secondary | ICD-10-CM | POA: Insufficient documentation

## 2024-06-12 DIAGNOSIS — G4762 Sleep related leg cramps: Secondary | ICD-10-CM | POA: Insufficient documentation

## 2024-06-12 DIAGNOSIS — E559 Vitamin D deficiency, unspecified: Secondary | ICD-10-CM | POA: Insufficient documentation

## 2024-06-12 DIAGNOSIS — R202 Paresthesia of skin: Secondary | ICD-10-CM | POA: Diagnosis not present

## 2024-06-12 DIAGNOSIS — G6289 Other specified polyneuropathies: Secondary | ICD-10-CM

## 2024-06-12 DIAGNOSIS — G629 Polyneuropathy, unspecified: Secondary | ICD-10-CM | POA: Insufficient documentation

## 2024-06-12 DIAGNOSIS — R7303 Prediabetes: Secondary | ICD-10-CM | POA: Insufficient documentation

## 2024-06-12 NOTE — Patient Instructions (Signed)
 Labs today   VISIT SUMMARY: Today, you were seen for lower extremity symptoms, including nocturnal leg cramps and tingling. We discussed your recent history, including your keto diet, use of distilled water, and past varicose vein procedure. We also reviewed your family history and current symptoms.  YOUR PLAN: -LOWER EXTREMITY NOCTURNAL CRAMPS AND NEUROPATHIC SYMPTOMS: You are experiencing leg cramps at night and a tingling sensation in your lower legs, which may be due to vitamin deficiencies or other systemic issue like peripheral neuropathy. We will check your blood for B12 and other B vitamins, as well as electrolytes like magnesium. In the meantime, please hold off on taking your current vitamins, stay well-hydrated, and increase your intake of fruits and vegetables. Regular exercise and stretching before bedtime are also recommended.  -MILD TO MODERATE MITRAL REGURGITATION: Mitral regurgitation is a condition where the heart's mitral valve doesn't close tightly, allowing blood to flow backward in the heart. Although you have no symptoms of heart issues, we will monitor for any signs such as shortness of breath, chest discomfort, or dizziness.  -VITAMIN D  DEFICIENCY: Vitamin D  deficiency means you have lower than normal levels of vitamin D , which is important for bone health. We will consider vitamin D  supplementation if you are not already taking it.  INSTRUCTIONS: We will follow up with blood work to check your B12, other B vitamins, and electrolytes. Please monitor for any new symptoms related to your heart condition and maintain good hydration and a balanced diet. Regular exercise and stretching before bedtime are also recommended.

## 2024-06-12 NOTE — Assessment & Plan Note (Signed)
 She states she was told she was in prediabetes range (11/2023) - update A1c.

## 2024-06-12 NOTE — Assessment & Plan Note (Addendum)
 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  Exam with brisk but symmetrical DTRs throughout as well as evidence of fasciculations to BLE calves. Will start evaluation with labwork to r/o reversible causes - including B vitamins, SPEP, TSH, ANA, inflammatory markers, CBC, CMP. Pending results discussed possible neurology evaluation, consideration for NCS/EMG.  Pt agrees with plan.

## 2024-06-12 NOTE — Assessment & Plan Note (Signed)
 Check electrolytes and CPK levels. Reviewed conservative measures including regular exercise, good hydration, and regular stretching prior to bedtime.

## 2024-06-12 NOTE — Telephone Encounter (Signed)
 FYI Only or Action Required?: FYI only for provider.  Patient was last seen in primary care on 04/05/2023 by Corwin Antu, FNP.  Called Nurse Triage reporting Tingling.  Symptoms began several months ago.  Interventions attempted: Nothing.  Symptoms are: gradually worsening.  Triage Disposition: See Physician Within 24 Hours  Patient/caregiver understands and will follow disposition?: Yes, will follow disposition  Copied from CRM #[id number]. Topic: Clinical - Red Word Triage >> Jun 12, 2024  8:27 AM Burnard DEL wrote: Red Word that prompted transfer to Nurse Triage: tingling feeling in feet,leg cramps Reason for Disposition  Localized pain, redness or hard lump along vein  Answer Assessment - Initial Assessment Questions 1. ONSET: When did the pain start?      About 2 months ago, cramping in calves at night 2. LOCATION: Where is the pain located?      BLE 3. PAIN: How bad is the pain?    (Scale 1-10; or mild, moderate, severe)     When it occurs, pt states that she has to get out of bed because the pain is so severe 4. WORK OR EXERCISE: Has there been any recent work or exercise that involved this part of the body?      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 CAUSE: What do you think is causing the leg pain?     Unsure, varicose vein hx 6. OTHER SYMPTOMS: Do you have any other symptoms? (e.g., chest pain, back pain, breathing difficulty, swelling, rash, fever, numbness, weakness)     Denies  Pt states that she had varicose vein treatment about 12 years ago, states that she can see the veins again.  Offered appt today at 1115, pt asked for a later appt, scheduled for 1530.  Protocols used: Leg Pain-A-AH

## 2024-06-12 NOTE — Assessment & Plan Note (Signed)
 Noted on recent echo ordered by [facility].  Denies h/o rheumatic fever. Denies current cardiac symptoms.

## 2024-06-12 NOTE — Assessment & Plan Note (Signed)
 Update vitamin D  levels off regular replacement in h/o same.

## 2024-06-12 NOTE — Progress Notes (Addendum)
 Ph: [phone] Fax: [phone]   Patient ID: Bethany Herman, female    DOB: 1966-04-22, 58 y.o.   MRN: [id number]  This visit was conducted in person.  BP 120/82   Pulse 64   Temp 98.1 F (36.7 C) (Oral)   Ht 5' 9 (1.753 m)   Wt 120 lb 4 oz (54.5 kg)   SpO2 100%   BMI 17.76 kg/m    Chief Complaint  Patient presents with   Leg Pain    Pt C/o Leg cramps started 2 mths ago along with tingly feeling. Leg cramp worse at night, tingly feeling happens all day.    Subjective:   Discussed the use of AI scribe software for clinical note transcription with the patient, who gave verbal consent to proceed.  Pleasant patient of [facility] who  has no past medical history on file.  History of Present Illness   Bethany Herman is a 58 year old female who presents with lower extremity symptoms, including nocturnal leg cramps and tingling.  She has experienced nocturnal leg cramps for the past two months, with worsening severity recently. The cramps wake her multiple times a night, requiring her to walk for relief. The frequency has increased, especially in the last three nights.  A new tingling sensation, described as 'pins and needles,' began two to three days ago, primarily in the feet and extending up to just below the knees, more pronounced on the left side. Occasional numbness accompanies the tingling, but there is no burning pain, weakness, or headaches.  She denies fevers, chills, new joint pains, rashes, headache, blurry vision or double vision or other vision changes, dizziness, palpitations, abdominal pain, nausea or bowel changes. She denies recent trauma or injury.   She has a history of varicose veins and underwent a procedure ten to twelve years ago. The varicose veins have returned and are worse than before. She follows a keto diet since February and recently switched to distilled water due to concerns over heavy metal exposure. She has a history of vitamin D  deficiency and  difficulty tolerating multivitamins due to gastrointestinal discomfort - attributes this to fillers in capsules.   Her family history includes Alzheimer's-like symptoms in mother and maternal aunts, heart problems, and varicose veins.      She's been taking Brite Brain vitamin with coq10, quercetin, citicoline and biotin as well as prodromeNeuro omega 3 with neuronal plasmalogen oil through [facility]. Was unable to tolerate MVI and other b complex recommended. She is also receiving HRT (Vivelle  Dot and Prometrium  oral) through their clinic.      Relevant past medical, surgical, family and social history reviewed and updated as indicated. Interim medical history since our last visit reviewed. Allergies and medications reviewed and updated. Outpatient Medications Prior to Visit  Medication Sig Dispense Refill   estradiol  (VIVELLE -DOT) 0.0375 MG/24HR SMARTSIG:1 Patch(s) T-DERMAL Every 3 Days     progesterone  (PROMETRIUM ) 100 MG capsule Take 100 mg by mouth at bedtime.     triamcinolone  cream (KENALOG ) 0.1 % Apply 1 Application topically 2 (two) times daily. (Patient not taking: Reported on 06/12/2024) 30 g 0   predniSONE  (DELTASONE ) 20 MG tablet Take two tablets po qd for five days, one tablet po qd for five days, then 1/2 tablet po qd for five days 18 tablet 0   No facility-administered medications prior to visit.     Per HPI unless specifically indicated in ROS section below Review of Systems  Objective:  BP 120/82   Pulse 64   Temp 98.1 F (36.7 C) (Oral)   Ht 5' 9 (1.753 m)   Wt 120 lb 4 oz (54.5 kg)   SpO2 100%   BMI 17.76 kg/m   Wt Readings from Last 3 Encounters:  06/12/24 120 lb 4 oz (54.5 kg)  04/05/23 121 lb 12.8 oz (55.2 kg)  12/31/22 130 lb (59 kg)            Physical Exam Vitals and nursing note reviewed.  Constitutional:      Appearance: Normal appearance. She is not ill-appearing.  HENT:     Head: Normocephalic and atraumatic.      Mouth/Throat:     Mouth: Mucous membranes are moist.     Pharynx: Oropharynx is clear. No oropharyngeal exudate or posterior oropharyngeal erythema.  Eyes:     Extraocular Movements: Extraocular movements intact.     Conjunctiva/sclera: Conjunctivae normal.     Pupils: Pupils are equal, round, and reactive to light.  Neck:     Thyroid: No thyroid mass or thyromegaly.  Cardiovascular:     Rate and Rhythm: Normal rate and regular rhythm.     Pulses: Normal pulses.  Pulmonary:     Effort: Pulmonary effort is normal. No respiratory distress.     Breath sounds: Normal breath sounds. No wheezing, rhonchi or rales.  Musculoskeletal:        General: No swelling, tenderness or signs of injury.     Cervical back: Normal range of motion and neck supple.     Right lower leg: No edema.     Left lower leg: No edema.     Comments:  2+ DP bilaterally FROM at knees and ankles without pain or ligament laxity Neg seated SLR bilaterally  Skin:    General: Skin is warm and dry.     Findings: No rash.  Neurological:     Mental Status: She is alert.     Cranial Nerves: Cranial nerves 2-12 are intact. No cranial nerve deficit, dysarthria or facial asymmetry.     Sensory: Sensation is intact.     Motor: Motor function is intact. No weakness or tremor.     Coordination: Coordination is intact.     Gait: Gait is intact.     Deep Tendon Reflexes:     Reflex Scores:      Bicep reflexes are 3+ on the right side and 3+ on the left side.      Patellar reflexes are 3+ on the right side and 3+ on the left side.    Comments:  CN 2-12 intact FTN intact EOMI 5/5 strength BUE, BLE Grip strength intact Sensation intact to upper and lower extremities  Sensation intact to light touch and monofilament testing to feet  Evident fasciculations to bilateral posterior calves   Psychiatric:        Mood and Affect: Mood normal.        Behavior: Behavior normal.       Results   Echocardiogram at [facility]  02/06/2024:  -  Left Ventricle: Left ventricle size is normal.   Left Ventricle: Systolic function is normal. EF: 55-60%.   Left Atrium: Left atrium size is normal.   Right Ventricle: Right ventricle size is normal.   Right Ventricle: Systolic function is normal.   Mitral Valve: There is mild to moderate regurgitation with a posteriorly directed jet.       Results for orders placed or performed in visit on 04/05/23  CBC   Collection Time: 04/05/23  1:10 PM  Result Value Ref Range   WBC 4.2 4.0 - 10.5 K/uL   RBC 4.41 3.87 - 5.11 Mil/uL   Platelets 226.0 150.0 - 400.0 K/uL   Hemoglobin 13.8 12.0 - 15.0 g/dL   HCT 57.6 63.9 - 53.9 %   MCV 95.9 78.0 - 100.0 fl   MCHC 32.7 30.0 - 36.0 g/dL   RDW 86.9 88.4 - 84.4 %  Lipid panel   Collection Time: 04/05/23  1:10 PM  Result Value Ref Range   Cholesterol 211 (H) 0 - 200 mg/dL   Triglycerides 42.9 0.0 - 149.0 mg/dL   HDL 05.79 >60.99 mg/dL   VLDL 88.5 0.0 - 59.9 mg/dL   LDL Cholesterol 893 (H) 0 - 99 mg/dL   Total CHOL/HDL Ratio 2    NonHDL 116.95   Basic metabolic panel   Collection Time: 04/05/23  1:10 PM  Result Value Ref Range   Sodium 140 135 - 145 mEq/L   Potassium 4.1 3.5 - 5.1 mEq/L   Chloride 103 96 - 112 mEq/L   CO2 29 19 - 32 mEq/L   Glucose, Bld 71 70 - 99 mg/dL   BUN 14 6 - 23 mg/dL   Creatinine, Ser 9.16 0.40 - 1.20 mg/dL   GFR 22.64 >39.99 mL/min   Calcium 9.6 8.4 - 10.5 mg/dL  Vitamin B12   Collection Time: 04/05/23  1:10 PM  Result Value Ref Range   Vitamin B-12 249 211 - 911 pg/mL    Assessment & Plan:      Lower extremity nocturnal cramps and neuropathic symptoms Progressive nocturnal leg cramps and new onset tingling in lower extremities, more pronounced on the left. Differential includes neuropathy possibly due to vitamin deficiencies or other systemic issue. Recent keto diet and reverse osmosis water filter use noted. - Order blood work for B12 and other B vitamins. - Check electrolytes, including  magnesium. - Recommend holding current brain supplements. - Advise good hydration and increased fruits and vegetables. - Encourage regular exercise with stretching before bedtime - Will need to verify no habitual alcohol use  Mild to moderate mitral regurgitation Mild to moderate mitral regurgitation on echocardiogram. No heart failure symptoms. - Monitor for symptoms such as shortness of breath, chest discomfort, or dizziness.  Vitamin D  deficiency Vitamin D  deficiency despite outdoor activity. - Update vitamin D  levels     Problem List Items Addressed This Visit     Peripheral neuropathy - Primary   Describes BLE length dependent paresthesias suspicious for axonal periph neuropathy.  Exam with brisk but symmetrical DTRs throughout as well as evidence of fasciculations to BLE calves. Will start evaluation with labwork to r/o reversible causes - including B vitamins, SPEP, TSH, ANA, inflammatory markers, CBC, CMP. Pending results discussed possible neurology evaluation, consideration for NCS/EMG.  Pt agrees with plan.       Relevant Orders   Comprehensive metabolic panel with GFR   TSH   CBC with Differential/Platelet   Vitamin B12   Magnesium   VITAMIN D  25 Hydroxy (Vit-D Deficiency, Fractures)   Vitamin B1   Folate   Sedimentation rate   C-reactive protein   Hemoglobin A1c   ANA   Serum protein electrophoresis with reflex   Nocturnal leg cramps   Check electrolytes and CPK levels. Reviewed conservative measures including regular exercise, good hydration, and regular stretching prior to bedtime.       Relevant Orders   CK   Mitral regurgitation  Noted on recent echo ordered by [facility].  Denies h/o rheumatic fever. Denies current cardiac symptoms.       Vitamin D  deficiency   Update vitamin D  levels off regular replacement in h/o same.       Relevant Orders   VITAMIN D  25 Hydroxy (Vit-D Deficiency, Fractures)   Prediabetes   She states she was  told she was in prediabetes range (11/2023) - update A1c.         No orders of the defined types were placed in this encounter.   Orders Placed This Encounter  Procedures   Comprehensive metabolic panel with GFR   TSH   CBC with Differential/Platelet   Vitamin B12   Magnesium   CK   VITAMIN D  25 Hydroxy (Vit-D Deficiency, Fractures)   Vitamin B1   Folate   Sedimentation rate   C-reactive protein   Hemoglobin A1c   ANA   Serum protein electrophoresis with reflex    Patient Instructions  Labs today   VISIT SUMMARY: Today, you were seen for lower extremity symptoms, including nocturnal leg cramps and tingling. We discussed your recent history, including your keto diet, use of distilled water, and past varicose vein procedure. We also reviewed your family history and current symptoms.  YOUR PLAN: -LOWER EXTREMITY NOCTURNAL CRAMPS AND NEUROPATHIC SYMPTOMS: You are experiencing leg cramps at night and a tingling sensation in your lower legs, which may be due to vitamin deficiencies or other systemic issue like peripheral neuropathy. We will check your blood for B12 and other B vitamins, as well as electrolytes like magnesium. In the meantime, please hold off on taking your current vitamins, stay well-hydrated, and increase your intake of fruits and vegetables. Regular exercise and stretching before bedtime are also recommended.  -MILD TO MODERATE MITRAL REGURGITATION: Mitral regurgitation is a condition where the heart's mitral valve doesn't close tightly, allowing blood to flow backward in the heart. Although you have no symptoms of heart issues, we will monitor for any signs such as shortness of breath, chest discomfort, or dizziness.  -VITAMIN D  DEFICIENCY: Vitamin D  deficiency means you have lower than normal levels of vitamin D , which is important for bone health. We will consider vitamin D  supplementation if you are not already taking it.  INSTRUCTIONS: We will follow up with  blood work to check your B12, other B vitamins, and electrolytes. Please monitor for any new symptoms related to your heart condition and maintain good hydration and a balanced diet. Regular exercise and stretching before bedtime are also recommended.  Follow up plan: No follow-ups on file.  Anton Blas, MD

## 2024-06-12 NOTE — Telephone Encounter (Signed)
 NOTED

## 2024-06-13 ENCOUNTER — Ambulatory Visit: Payer: Self-pay | Admitting: Family Medicine

## 2024-06-13 DIAGNOSIS — R253 Fasciculation: Secondary | ICD-10-CM

## 2024-06-13 DIAGNOSIS — G4762 Sleep related leg cramps: Secondary | ICD-10-CM

## 2024-06-13 DIAGNOSIS — G6289 Other specified polyneuropathies: Secondary | ICD-10-CM

## 2024-06-13 LAB — CBC WITH DIFFERENTIAL/PLATELET
Basophils Absolute: 0 K/uL (ref 0.0–0.1)
Basophils Relative: 0.8 % (ref 0.0–3.0)
Eosinophils Absolute: 0 K/uL (ref 0.0–0.7)
Eosinophils Relative: 0.3 % (ref 0.0–5.0)
HCT: 40.8 % (ref 36.0–46.0)
Hemoglobin: 13.4 g/dL (ref 12.0–15.0)
Lymphocytes Relative: 31.3 % (ref 12.0–46.0)
Lymphs Abs: 1.3 K/uL (ref 0.7–4.0)
MCHC: 32.9 g/dL (ref 30.0–36.0)
MCV: 96.8 fl (ref 78.0–100.0)
Monocytes Absolute: 0.4 K/uL (ref 0.1–1.0)
Monocytes Relative: 10.5 % (ref 3.0–12.0)
Neutro Abs: 2.4 K/uL (ref 1.4–7.7)
Neutrophils Relative %: 57.1 % (ref 43.0–77.0)
Platelets: 209 K/uL (ref 150.0–400.0)
RBC: 4.21 Mil/uL (ref 3.87–5.11)
RDW: 14.2 % (ref 11.5–15.5)
WBC: 4.2 K/uL (ref 4.0–10.5)

## 2024-06-13 LAB — COMPREHENSIVE METABOLIC PANEL WITH GFR
ALT: 20 U/L (ref 0–35)
AST: 34 U/L (ref 0–37)
Albumin: 4.4 g/dL (ref 3.5–5.2)
Alkaline Phosphatase: 74 U/L (ref 39–117)
BUN: 21 mg/dL (ref 6–23)
CO2: 31 meq/L (ref 19–32)
Calcium: 9.1 mg/dL (ref 8.4–10.5)
Chloride: 101 meq/L (ref 96–112)
Creatinine, Ser: 0.85 mg/dL (ref 0.40–1.20)
GFR: 74.55 mL/min (ref >60.00)
Glucose, Bld: 89 mg/dL (ref 70–99)
Potassium: 4 meq/L (ref 3.5–5.1)
Sodium: 139 meq/L (ref 135–145)
Total Bilirubin: 0.7 mg/dL (ref 0.2–1.2)
Total Protein: 6.7 g/dL (ref 6.0–8.3)

## 2024-06-13 LAB — MAGNESIUM: Magnesium: 2.2 mg/dL (ref 1.5–2.5)

## 2024-06-13 LAB — CK: Total CK: 258 U/L — ABNORMAL HIGH (ref 17–177)

## 2024-06-13 LAB — HEMOGLOBIN A1C: Hgb A1c MFr Bld: 5.8 % (ref 4.6–6.5)

## 2024-06-13 LAB — TSH: TSH: 2.7 u[IU]/mL (ref 0.35–5.50)

## 2024-06-13 LAB — C-REACTIVE PROTEIN: CRP: <1 mg/dL (ref 0.5–20.0)

## 2024-06-13 LAB — VITAMIN B12: Vitamin B-12: 489 pg/mL (ref 211–911)

## 2024-06-13 LAB — VITAMIN D 25 HYDROXY (VIT D DEFICIENCY, FRACTURES): VITD: 25.7 ng/mL — ABNORMAL LOW (ref 30.00–100.00)

## 2024-06-13 LAB — FOLATE: Folate: 20.8 ng/mL (ref >5.9)

## 2024-06-13 LAB — SEDIMENTATION RATE: Sed Rate: 4 mm/h (ref 0–30)

## 2024-06-14 ENCOUNTER — Telehealth: Payer: Self-pay | Admitting: Family

## 2024-06-14 NOTE — Telephone Encounter (Signed)
 Copied from CRM #[id number]. Topic: Referral - Question >> Jun 14, 2024 11:45 AM Bethany Herman wrote: Reason for CRM: Patient states that Dr. MATSU has referred her to Neurology but the soonest appointments she could get was 08/27/2024 @ 8am. Patient would like to know if its okay for her to wait for this appointment based off the symptoms her and Dr. MATSU discussed prior. Patient is requesting a call back and can be reached at [phone].

## 2024-06-14 NOTE — Telephone Encounter (Signed)
 Is this appt okay?

## 2024-06-15 NOTE — Telephone Encounter (Addendum)
 Replied via mychart.

## 2024-06-16 LAB — PROTEIN ELECTROPHORESIS, SERUM, WITH REFLEX
Albumin ELP: 4.7 g/dL (ref 3.8–4.8)
Alpha 1: 0.2 g/dL (ref 0.2–0.3)
Alpha 2: 0.6 g/dL (ref 0.5–0.9)
Beta 2: 0.4 g/dL (ref 0.2–0.5)
Beta Globulin: 0.4 g/dL (ref 0.4–0.6)
Gamma Globulin: 0.7 g/dL — ABNORMAL LOW (ref 0.8–1.7)
Total Protein: 7 g/dL (ref 6.1–8.1)

## 2024-06-16 LAB — IFE INTERPRETATION

## 2024-06-16 LAB — ANA: Anti Nuclear Antibody (ANA): NEGATIVE

## 2024-06-17 LAB — VITAMIN B1: Vitamin B1 (Thiamine): 11 nmol/L (ref 8–30)

## 2024-06-18 ENCOUNTER — Other Ambulatory Visit: Payer: Self-pay | Admitting: Family

## 2024-06-18 MED ORDER — B COMPLEX VITAMINS PO CAPS: 1.0000 | ORAL_CAPSULE | Freq: Every day | ORAL | Status: AC

## 2024-06-18 MED ORDER — VITAMIN D3 250 MCG (10000 UT) PO CAPS: 10000.0000 [IU] | ORAL_CAPSULE | ORAL | Status: AC

## 2024-06-18 NOTE — Addendum Note (Signed)
 Addended by: RILLA BALLER on: 06/18/2024 07:32 AM   Modules accepted: Orders

## 2024-06-20 NOTE — Telephone Encounter (Signed)
 Thank you so much for seeing her.  Do you think that the alcohol is the main driving possibility right now and hold off on neuro referral to see if improvement with decreased intake ?

## 2024-07-23 DIAGNOSIS — H2513 Age-related nuclear cataract, bilateral: Secondary | ICD-10-CM | POA: Diagnosis not present

## 2024-07-23 DIAGNOSIS — H5203 Hypermetropia, bilateral: Secondary | ICD-10-CM | POA: Diagnosis not present

## 2024-08-27 ENCOUNTER — Ambulatory Visit: Admitting: Neurology
